# Patient Record
Sex: Male | Born: 1970 | Race: White | Hispanic: No | Marital: Married | Smoking: Current every day smoker
Health system: Southern US, Community
[De-identification: ages and names within clinical notes are randomized; demographics above are authoritative.]

## PROBLEM LIST (undated history)

## (undated) DIAGNOSIS — E119 Type 2 diabetes mellitus without complications: Secondary | ICD-10-CM

## (undated) DIAGNOSIS — Z9109 Other allergy status, other than to drugs and biological substances: Secondary | ICD-10-CM

## (undated) DIAGNOSIS — E669 Obesity, unspecified: Secondary | ICD-10-CM

## (undated) DIAGNOSIS — M5020 Other cervical disc displacement, unspecified cervical region: Secondary | ICD-10-CM

## (undated) DIAGNOSIS — F319 Bipolar disorder, unspecified: Secondary | ICD-10-CM

## (undated) DIAGNOSIS — M542 Cervicalgia: Secondary | ICD-10-CM

## (undated) HISTORY — DX: Other allergy status, other than to drugs and biological substances: Z91.09

## (undated) HISTORY — DX: Cervicalgia: M54.2

## (undated) HISTORY — DX: Type 2 diabetes mellitus without complications: E11.9

## (undated) HISTORY — PX: VASECTOMY: SHX75

## (undated) HISTORY — DX: Bipolar disorder, unspecified: F31.9

## (undated) HISTORY — DX: Other cervical disc displacement, unspecified cervical region: M50.20

## (undated) HISTORY — DX: Obesity, unspecified: E66.9

---

## 2005-07-10 ENCOUNTER — Encounter: Payer: Self-pay | Admitting: Family Medicine

## 2005-07-10 ENCOUNTER — Ambulatory Visit: Payer: Self-pay | Admitting: Chiropractic Medicine

## 2005-07-11 ENCOUNTER — Ambulatory Visit: Payer: Self-pay | Admitting: Chiropractic Medicine

## 2008-11-08 ENCOUNTER — Ambulatory Visit: Payer: Self-pay | Admitting: Family Medicine

## 2008-11-08 DIAGNOSIS — J309 Allergic rhinitis, unspecified: Secondary | ICD-10-CM | POA: Insufficient documentation

## 2008-11-08 DIAGNOSIS — E669 Obesity, unspecified: Secondary | ICD-10-CM

## 2008-11-08 DIAGNOSIS — M542 Cervicalgia: Secondary | ICD-10-CM

## 2008-11-08 DIAGNOSIS — R5383 Other fatigue: Secondary | ICD-10-CM

## 2008-11-08 DIAGNOSIS — R5381 Other malaise: Secondary | ICD-10-CM | POA: Insufficient documentation

## 2008-11-10 ENCOUNTER — Ambulatory Visit: Payer: Self-pay | Admitting: Family Medicine

## 2008-11-14 LAB — CONVERTED CEMR LAB
ALT: 26 units/L (ref 0–53)
AST: 22 units/L (ref 0–37)
Albumin: 4.1 g/dL (ref 3.5–5.2)
BUN: 18 mg/dL (ref 6–23)
Basophils Relative: 1 % (ref 0.0–3.0)
Chloride: 105 meq/L (ref 96–112)
Creatinine, Ser: 1 mg/dL (ref 0.4–1.5)
Eosinophils Absolute: 0.3 10*3/uL (ref 0.0–0.7)
Eosinophils Relative: 4.3 % (ref 0.0–5.0)
GFR calc non Af Amer: 90 mL/min
Glucose, Bld: 90 mg/dL (ref 70–99)
HCT: 44.5 % (ref 39.0–52.0)
MCV: 90.1 fL (ref 78.0–100.0)
Neutrophils Relative %: 49.4 % (ref 43.0–77.0)
RBC: 4.94 M/uL (ref 4.22–5.81)
TSH: 1.97 microintl units/mL (ref 0.35–5.50)
Total Protein: 6.6 g/dL (ref 6.0–8.3)
WBC: 6.3 10*3/uL (ref 4.5–10.5)

## 2009-04-26 ENCOUNTER — Telehealth: Payer: Self-pay | Admitting: Family Medicine

## 2010-03-29 ENCOUNTER — Ambulatory Visit: Payer: Self-pay | Admitting: Family Medicine

## 2010-03-29 DIAGNOSIS — F319 Bipolar disorder, unspecified: Secondary | ICD-10-CM | POA: Insufficient documentation

## 2010-04-26 ENCOUNTER — Ambulatory Visit: Payer: Self-pay | Admitting: Family Medicine

## 2010-05-16 ENCOUNTER — Telehealth: Payer: Self-pay | Admitting: Family Medicine

## 2010-05-23 ENCOUNTER — Ambulatory Visit: Payer: Self-pay | Admitting: Family Medicine

## 2010-10-02 NOTE — Assessment & Plan Note (Signed)
Summary: 6 wk f/u dlo   Vital Signs:  Patient profile:   40 year old male Height:      72 inches Weight:      214.0 pounds BMI:     29.13 Temp:     99.0 degrees F oral Pulse rate:   72 / minute Pulse rhythm:   regular BP sitting:   110 / 70  (left arm) Cuff size:   regular  Vitals Entered By: Benny Lennert CMA Duncan Dull) (May 23, 2010 9:33 AM)  History of Present Illness: Chief complaint 6 week follow up   40 year old male:      Allergies (verified): No Known Drug Allergies   Impression & Recommendations:  Problem # 1:  DEPRESSION (ICD-311) >15 minutes spent in face to face time with patient, >50% spent in counselling or coordination of care: doing somewhat better, but having several episodes of anorgasmia. Discussed +/- and will titrate of paxil and titrate on celexa. C/w therapist once a week. sleep schedule still eratic with band.   The following medications were removed from the medication list:    Paroxetine Hcl 25 Mg Xr24h-tab (Paroxetine hcl) .Marland Kitchen... 1 by mouth daily    Paroxetine Hcl 12.5 Mg Xr24h-tab (Paroxetine hcl) .Marland Kitchen... 1 by mouth daily His updated medication list for this problem includes:    Trazodone Hcl 100 Mg Tabs (Trazodone hcl) .Marland Kitchen... 1 by mouth at bedtime as needed insomnia    Citalopram Hydrobromide 20 Mg Tabs (Citalopram hydrobromide) .Marland Kitchen... 1 by mouth daily  Complete Medication List: 1)  Trazodone Hcl 100 Mg Tabs (Trazodone hcl) .Marland Kitchen.. 1 by mouth at bedtime as needed insomnia 2)  Citalopram Hydrobromide 20 Mg Tabs (Citalopram hydrobromide) .Marland Kitchen.. 1 by mouth daily  Patient Instructions: 1)  Paxil dosing: go down to 25 mg a day for 1 week 2)  Then go down to 12.5 mg a day for 1 week 3)  At the same time that you start this at 12.5 mg, cut the celexa 20 mg tablets in half (10 mg), and do this for 1 week 4)  When off paxil, take Citalopram 20 mg a day 5)  f/u 6 weeks Prescriptions: CITALOPRAM HYDROBROMIDE 20 MG TABS (CITALOPRAM HYDROBROMIDE) 1 by  mouth daily  #30 x 5   Entered and Authorized by:   Hannah Beat MD   Signed by:   Hannah Beat MD on 05/23/2010   Method used:   Electronically to        [facility]. [phone]* (retail)       [address]       [place], [place]  [address]       Ph: [phone]       Fax: [phone]   RxID:   [id number]   Current Allergies (reviewed today): No known allergies

## 2010-10-02 NOTE — Assessment & Plan Note (Signed)
Summary: ROA 1 MTHS CYD   Vital Signs:  Patient profile:   40 year old male Height:      72 inches Weight:      211.8 pounds BMI:     28.83 Temp:     98.7 degrees F oral Pulse rate:   64 / minute Pulse rhythm:   regular BP sitting:   120 / 70  (left arm) Cuff size:   regular  Vitals Entered By: Benny Lennert CMA Duncan Dull) (April 26, 2010 9:36 AM) CC: 1 month follow up   Allergies (verified): No Known Drug Allergies   Impression & Recommendations:  Problem # 1:  DEPRESSION (ICD-311) >15 minutes spent in face to face time with patient, >50% spent in counselling or coordination of care: the patient is about the same. Not worse. Has gone to one counseling session. He is tolerating his Paxil without side effects. No physical activity. Since the last office visit, he has not smoked any. He had previously been smoking every day for about 10 years. And then increase his Paxil dosing to 37.5 mg, add some trazodone at night for sleep. He is sleeping about 4-5 hours a night. Followup 4-5 weeks  The following medications were removed from the medication list:    Paroxetine Hcl 12.5 Mg Xr24h-tab (Paroxetine hcl) .Marland Kitchen... 1 by mouth daily for 1 week, then increase to 2 by mouth daily    Paroxetine Hcl 25 Mg Xr24h-tab (Paroxetine hcl) .Marland Kitchen... 1 by mouth daily His updated medication list for this problem includes:    Paroxetine Hcl 25 Mg Xr24h-tab (Paroxetine hcl) .Marland Kitchen... 1 by mouth daily    Trazodone Hcl 100 Mg Tabs (Trazodone hcl) .Marland Kitchen... 1 by mouth at bedtime as needed insomnia    Paroxetine Hcl 12.5 Mg Xr24h-tab (Paroxetine hcl) .Marland Kitchen... 1 by mouth daily  Complete Medication List: 1)  Paroxetine Hcl 25 Mg Xr24h-tab (Paroxetine hcl) .Marland Kitchen.. 1 by mouth daily 2)  Trazodone Hcl 100 Mg Tabs (Trazodone hcl) .Marland Kitchen.. 1 by mouth at bedtime as needed insomnia 3)  Paroxetine Hcl 12.5 Mg Xr24h-tab (Paroxetine hcl) .Marland Kitchen.. 1 by mouth daily  Patient Instructions: 1)  f/u 4-5 weeks Prescriptions: PAROXETINE HCL 12.5 MG  XR24H-TAB (PAROXETINE HCL) 1 by mouth daily  #30 x 11   Entered and Authorized by:   Hannah Beat MD   Signed by:   Hannah Beat MD on 04/26/2010   Method used:   Print then Give to Patient   RxID:   [id number] TRAZODONE HCL 100 MG TABS (TRAZODONE HCL) 1 by mouth at bedtime as needed insomnia  #30 x 5   Entered and Authorized by:   Hannah Beat MD   Signed by:   Hannah Beat MD on 04/26/2010   Method used:   Print then Give to Patient   RxID:   [id number] PAROXETINE HCL 25 MG XR24H-TAB (PAROXETINE HCL) 1 by mouth daily  #30 x 11   Entered and Authorized by:   Hannah Beat MD   Signed by:   Hannah Beat MD on 04/26/2010   Method used:   Print then Give to Patient   RxID:   [id number] PAROXETINE HCL 25 MG XR24H-TAB (PAROXETINE HCL) 1 by mouth daily  #30 x 11   Entered and Authorized by:   Hannah Beat MD   Signed by:   Hannah Beat MD on 04/26/2010   Method used:   Print then Give to Patient   RxID:   [id number]   Current Allergies (reviewed today): No known  allergies

## 2010-10-02 NOTE — Assessment & Plan Note (Signed)
Summary: NOT FEELING WELL/CLE   Vital Signs:  Patient profile:   40 year old male Height:      72 inches Weight:      217.6 pounds BMI:     29.62 Temp:     98.7 degrees F oral Pulse rate:   64 / minute Pulse rhythm:   regular BP sitting:   110 / 74  (left arm) Cuff size:   regular  Vitals Entered By: Benny Lennert CMA Duncan Dull) (March 29, 2010 4:03 PM)  History of Present Illness: Chief complaint Not feeling well  Allergies (verified): No Known Drug Allergies   Impression & Recommendations:  Problem # 1:  DEPRESSION (ICD-311) >25 minutes spent in face to face time with patient, >50% spent in counselling or coordination of care: the patient is significantly depressed. He is only sleeping about 6 hours a night, and he wants to stay in bed longer than normal. Is not having fun his normal activities. He did not want play music. He is not homicidal or suicidal. He is not interacting with his family or friends as he normally does. He did get paranoid some and smoking some MJ, but he has not done this in about a month. Does have a strong family history of anxiety and depression. Titrate up Paxil. The patient is able to get counseling through his wife's work, and we recommended this very much. I'll also check a thyroid.  His updated medication list for this problem includes:    Paroxetine Hcl 12.5 Mg Xr24h-tab (Paroxetine hcl) .Marland Kitchen... 1 by mouth daily for 1 week, then increase to 2 by mouth daily    Paroxetine Hcl 25 Mg Xr24h-tab (Paroxetine hcl) .Marland Kitchen... 1 by mouth daily  Orders: Venipuncture ([address]) TLB-TSH (Thyroid Stimulating Hormone) [phone]) Psychology Referral (Psychology)  Complete Medication List: 1)  Paroxetine Hcl 12.5 Mg Xr24h-tab (Paroxetine hcl) .Marland Kitchen.. 1 by mouth daily for 1 week, then increase to 2 by mouth daily 2)  Paroxetine Hcl 25 Mg Xr24h-tab (Paroxetine hcl) .Marland Kitchen.. 1 by mouth daily  Patient Instructions: 1)  Referral Appointment Information 2)  Day/Date: 3)   Time: 4)  Place/MD: 5)  Address: 6)  Phone/Fax: 7)  Patient given appointment information. Information/Orders faxed/mailed.  8)  FOLLOW-UP WITH ME IN 1 MONTH Prescriptions: PAROXETINE HCL 25 MG XR24H-TAB (PAROXETINE HCL) 1 by mouth daily  #30 x 0   Entered and Authorized by:   Hannah Beat MD   Signed by:   Hannah Beat MD on 03/29/2010   Method used:   Print then Give to Patient   RxID:   [id number] PAROXETINE HCL 12.5 MG XR24H-TAB (PAROXETINE HCL) 1 by mouth daily for 1 week, then increase to 2 by mouth daily  #60 x 0   Entered and Authorized by:   Hannah Beat MD   Signed by:   Hannah Beat MD on 03/29/2010   Method used:   Print then Give to Patient   RxID:   [id number] PAROXETINE HCL 25 MG XR24H-TAB (PAROXETINE HCL) 1 by mouth daily  #30 x 0   Entered and Authorized by:   Hannah Beat MD   Signed by:   Hannah Beat MD on 03/29/2010   Method used:   Print then Give to Patient   RxID:   [id number] PAROXETINE HCL 12.5 MG XR24H-TAB (PAROXETINE HCL) 1 by mouth daily  #60 x 1   Entered and Authorized by:   Hannah Beat MD   Signed by:   Hannah Beat MD on 03/29/2010  Method used:   Print then Give to Patient   RxID:   [id number]   Current Allergies (reviewed today): No known allergies

## 2010-10-02 NOTE — Progress Notes (Signed)
Summary: Rx Paroxetine  Phone Note Refill Request Call back at [phone] Message from:  [facility] on May 16, 2010 1:09 PM  Refills Requested: Medication #1:  PAROXETINE HCL 25 MG XR24H-TAB 1 by mouth daily   Last Refilled: 04/26/2010 Received E-script request please advise.  This is a Dr. Patsy Lager patient and he will be out of the office until next week.   Method Requested: Telephone to Pharmacy Initial call taken by: Linde Gillis CMA Duncan Dull),  May 16, 2010 1:09 PM    Prescriptions: PAROXETINE HCL 25 MG XR24H-TAB (PAROXETINE HCL) 1 by mouth daily  #30 x 5   Entered and Authorized by:   Kerby Nora MD   Signed by:   Kerby Nora MD on 05/16/2010   Method used:   Electronically to        [facility]. [phone]* (retail)       [address]       [place], [place]  [address]       Ph: [phone]       Fax: [phone]   RxID:   [id number]

## 2011-11-11 ENCOUNTER — Other Ambulatory Visit (INDEPENDENT_AMBULATORY_CARE_PROVIDER_SITE_OTHER): Payer: BC Managed Care – PPO

## 2011-11-11 DIAGNOSIS — R5381 Other malaise: Secondary | ICD-10-CM

## 2011-11-11 DIAGNOSIS — R5383 Other fatigue: Secondary | ICD-10-CM

## 2011-11-11 DIAGNOSIS — Z13220 Encounter for screening for lipoid disorders: Secondary | ICD-10-CM

## 2011-11-11 LAB — COMPREHENSIVE METABOLIC PANEL
ALT: 21 U/L (ref 0–53)
Albumin: 4.3 g/dL (ref 3.5–5.2)
CO2: 28 mEq/L (ref 19–32)
Calcium: 9.3 mg/dL (ref 8.4–10.5)
Chloride: 103 mEq/L (ref 96–112)
GFR: 85.76 mL/min (ref >60.00)
Potassium: 4.8 mEq/L (ref 3.5–5.1)
Sodium: 138 mEq/L (ref 135–145)
Total Protein: 6.8 g/dL (ref 6.0–8.3)

## 2011-11-11 LAB — LIPID PANEL
Total CHOL/HDL Ratio: 5
Triglycerides: 170 mg/dL — ABNORMAL HIGH (ref 0.0–149.0)

## 2011-11-15 ENCOUNTER — Encounter: Payer: Self-pay | Admitting: Family Medicine

## 2011-11-18 ENCOUNTER — Encounter: Payer: Self-pay | Admitting: Family Medicine

## 2011-11-18 ENCOUNTER — Ambulatory Visit (INDEPENDENT_AMBULATORY_CARE_PROVIDER_SITE_OTHER): Payer: BC Managed Care – PPO | Admitting: Family Medicine

## 2011-11-18 VITALS — BP 116/78 | HR 82 | Temp 97.9°F | Ht 72.0 in | Wt 216.0 lb

## 2011-11-18 DIAGNOSIS — Z800 Family history of malignant neoplasm of digestive organs: Secondary | ICD-10-CM

## 2011-11-18 DIAGNOSIS — Z1211 Encounter for screening for malignant neoplasm of colon: Secondary | ICD-10-CM

## 2011-11-18 DIAGNOSIS — G4730 Sleep apnea, unspecified: Secondary | ICD-10-CM

## 2011-11-18 DIAGNOSIS — Z0000 Encounter for general adult medical examination without abnormal findings: Secondary | ICD-10-CM

## 2011-11-18 NOTE — Progress Notes (Signed)
Patient Name: Alexander [name] Date of Birth: 11/20/1970 Age: 41 y.o. Medical Record Number: [id number] Gender: male Date of Encounter: 11/18/2011  History of Present Illness:  Alexander [name] is a 41 y.o. very pleasant male patient who presents with the following:  Not sleeping well - still feeling like his mind is racing Lost the desire and to practice and to have as much fun  Dr. Zorita Pang [phone]  Had been on trazadone. Maybe something else? For sleep For a short time on Geodon, then Zyprexa  The patient was diagnosed with bipolar disorder last year, and he has been significantly depressed off and on for the last 3 years. He did have a episode of hypomania, that ultimately he saw psychiatry that led to his current diagnosis. 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  Preventative Health Maintenance Visit:  Health Maintenance Summary Reviewed and updated, unless pt declines services.  Tobacco History Reviewed. Alcohol: no etoh or mj now since dx Exercise Habits: rare STD concerns: no risk or activity to increase risk Drug Use: None Encouraged self-testicular check  Labs reviewed with the patient.   Lipids:    Component Value Date/Time   CHOL 172 11/11/2011 1055   TRIG 170.0* 11/11/2011 1055   HDL 36.10* 11/11/2011 1055   VLDL 34.0 11/11/2011 1055   CHOLHDL 5 11/11/2011 1055    CBC:    Component Value Date/Time   WBC 6.3 11/10/2008 0901   HGB 15.4 11/10/2008 0901   HCT 44.5 11/10/2008 0901   PLT 237 11/10/2008 0901   MCV 90.1 11/10/2008 0901   NEUTROABS 3.0 11/10/2008 0901   MONOABS 0.5 11/10/2008 0901   EOSABS 0.3 11/10/2008 0901   BASOSABS 0.1 11/10/2008 0901    Basic Metabolic Panel:    Component Value Date/Time   NA 138 11/11/2011 1055   K 4.8 11/11/2011 1055   CL 103 11/11/2011 1055   CO2 28 11/11/2011 1055   BUN 17 11/11/2011 1055   CREATININE 1.0 11/11/2011 1055   GLUCOSE 92 11/11/2011 1055   CALCIUM 9.3  11/11/2011 1055    Lab Results  Component Value Date   ALT 21 11/11/2011   AST 12 11/11/2011   ALKPHOS 62 11/11/2011   BILITOT 0.4 11/11/2011   Patient Active Problem List  Diagnoses  . OBESITY  . Bipolar 1 disorder  . ALLERGIC RHINITIS  . NECK PAIN, CHRONIC  . FATIGUE   Past Medical History  Diagnosis Date  . Pollen allergies   . Neck pain   . Herniated cervical disc     C6  . Obesity    No past surgical history on file. History  Substance Use Topics  . Smoking status: Never Smoker   . Smokeless tobacco: Not on file  . Alcohol Use: Yes   No family history on file. No Known Allergies No current outpatient prescriptions on file prior to visit.      Past Medical History, Surgical History, Social History, Family History, Problem List, Medications, and Allergies have been reviewed and updated if relevant.  Review of Systems:  General: Denies fever, chills, sweats. No significant weight loss. GENERAL FATIGUE, LACK OF SLEEP Eyes: Denies blurring,significant itching ENT: Denies earache, sore throat, and hoarseness. Cardiovascular: Denies chest pains, palpitations, dyspnea on exertion Respiratory: Denies cough, dyspnea at rest,wheeezing Breast: no concerns about lumps GI: Denies nausea, vomiting, diarrhea, constipation, change in bowel habits, abdominal pain, melena, hematochezia GU: Denies penile discharge, ED, urinary flow / outflow problems. No  STD concerns. Musculoskeletal: Denies back pain, joint pain Derm: Denies rash, itching Neuro: Denies  paresthesias, frequent falls, frequent headaches Psych: CONTINUED DEPRESSION. NO MANIA. POOR SLEEP ONGOING FOR YEARS. FATIGUED MUCH OF THE DAY Endocrine: Denies cold intolerance, heat intolerance, polydipsia Heme: Denies enlarged lymph nodes Allergy: No hayfever   Physical Examination: Filed Vitals:   11/18/11 1442  BP: 116/78  Pulse: 82  Temp: 97.9 F (36.6 C)  TempSrc: Oral  Height: 6' (1.829 m)  Weight: 216 lb  (97.977 kg)  SpO2: 99%    Body mass index is 29.29 kg/(m^2).   Wt Readings from Last 3 Encounters:  11/18/11 216 lb (97.977 kg)  05/23/10 214 lb (97.07 kg)  04/26/10 211 lb 12.8 oz (96.072 kg)    GEN: well developed, well nourished, no acute distress Eyes: conjunctiva and lids normal, PERRLA, EOMI ENT: TM clear, nares clear, oral exam WNL Neck: supple, no lymphadenopathy, no thyromegaly, no JVD Pulm: clear to auscultation and percussion, respiratory effort normal CV: regular rate and rhythm, S1-S2, no murmur, rub or gallop, no bruits, peripheral pulses normal and symmetric, no cyanosis, clubbing, edema or varicosities Chest: no scars, masses GI: soft, non-tender; no hepatosplenomegaly, masses; active bowel sounds all quadrants Lymph: no cervical, axillary or inguinal adenopathy MSK: gait normal, muscle tone and strength WNL, no joint swelling, effusions, discoloration, crepitus  SKIN: clear, good turgor, color WNL, no rashes, lesions, or ulcerations Neuro: normal mental status, normal strength, sensation, and motion Psych: alert; oriented to person, place and time. Flat affect   Assessment and Plan:  1. Routine general medical examination at a health care facility    2. Sleep apnea  Ambulatory referral to Pulmonology  3. Screening for colon cancer  Ambulatory referral to Gastroenterology  4. Family history of colon cancer  Ambulatory referral to Gastroenterology    Orders Today: Orders Placed This Encounter  Procedures  . Ambulatory referral to Pulmonology    Referral Priority:  Routine    Referral Type:  Consultation    Referral Reason:  Specialty Services Required    Requested Specialty:  Pulmonary Disease    Number of Visits Requested:  1  . Ambulatory referral to Gastroenterology    Referral Priority:  Routine    Referral Type:  Consultation    Referral Reason:  Specialty Services Required    Requested Specialty:  Gastroenterology    Number of Visits Requested:   1    Medications Today: Meds ordered this encounter  Medications  . ABILIFY 20 MG tablet    Sig: Take 1 tablet by mouth daily.  Marland Kitchen buPROPion (WELLBUTRIN SR) 150 MG 12 hr tablet    Sig: Take 2 tablets by mouth daily.  Marland Kitchen zolpidem (AMBIEN) 10 MG tablet    Sig: Take 0.5 tablets by mouth daily.    The patient's preventative maintenance and recommended screening tests for an annual wellness exam were reviewed in full today. Brought up to date unless services declined.  Counselled on the importance of diet, exercise, and its role in overall health and mortality. The patient's FH and SH was reviewed, including their home life, tobacco status, and drug and alcohol status.   Given mothers early age of diagnosis at colon cancer diagnosis, at age 38, her operating surgeon and cancer team recommended that the patient and his siblings have early colonoscopy for screening of colon cancer given their increased risk.  The patient is fatigued and feels tired much of the day. He never sleeps well. He will sleep for  several hours, then wake up, and will sleep off and on for the rest of the night. He is 216 pounds, and his psychiatrist raises the question of sleep apnea, which is a very viable question. Consult sleep medicine for their opinion and consideration and testing for potential sleep apnea or other suggestions.

## 2011-11-18 NOTE — Patient Instructions (Signed)
REFERRAL: GO THE THE FRONT ROOM AT THE ENTRANCE OF OUR CLINIC, NEAR CHECK IN. ASK FOR MARION. SHE WILL HELP YOU SET UP YOUR REFERRAL. DATE: TIME:  

## 2011-11-19 ENCOUNTER — Encounter: Payer: Self-pay | Admitting: Family Medicine

## 2011-11-20 ENCOUNTER — Encounter: Payer: Self-pay | Admitting: Internal Medicine

## 2011-12-05 ENCOUNTER — Encounter: Payer: Self-pay | Admitting: Internal Medicine

## 2011-12-09 ENCOUNTER — Encounter: Payer: Self-pay | Admitting: Pulmonary Disease

## 2011-12-09 ENCOUNTER — Ambulatory Visit (INDEPENDENT_AMBULATORY_CARE_PROVIDER_SITE_OTHER): Payer: BC Managed Care – PPO | Admitting: Pulmonary Disease

## 2011-12-09 VITALS — BP 132/78 | HR 90 | Temp 98.8°F | Ht 72.0 in | Wt 218.8 lb

## 2011-12-09 DIAGNOSIS — Z72821 Inadequate sleep hygiene: Secondary | ICD-10-CM

## 2011-12-09 DIAGNOSIS — F518 Other sleep disorders not due to a substance or known physiological condition: Secondary | ICD-10-CM

## 2011-12-09 NOTE — Progress Notes (Signed)
  Subjective:    Patient ID: Alexander [name], male    DOB: 1970-12-04, 41 y.o.   MRN: [id number]  HPI The patient is a 41 year old male who I've been asked to see for difficulty maintaining sleep.  This is been going on for at least one year, and is complicated by her recent diagnosis of bipolar disorder.  The patient states that he starts getting sleepy around 10 PM while in his lounge chair watching TV, and then will doze off and on through the night in total approximately 3 to 4 AM.  He thinks he will sleep at least 2-3 hours during this time.  He will then get up and go to bed, but awakens multiple times with difficulty getting back to sleep intermittently.  He will typically get up between 12 noon and 2 PM to start his day.  He has been noted to have snoring, and admits to occasional snoring arousals.  His spouse does not sleep in the same bed, but no one has mentioned an abnormal breathing pattern during sleep.  His father has been diagnosed with sleep apnea.  The patient denies any chronic pain issues, and feels that his sleeping environment is adequate.  He has a pet that sleeps in the same room with him, but not in the same bed.  He denies kicking during sleep, and also has no symptoms consistent with RLS.  He has some sleep pressure during the day, but it is not overwhelming.  His Epworth score today is only 7.  The patient also states that he has lost 20 pounds over the last 2 years.   Review of Systems  Constitutional: Negative for fever and unexpected weight change.  HENT: Negative for ear pain, nosebleeds, congestion, sore throat, rhinorrhea, sneezing, trouble swallowing, dental problem, postnasal drip and sinus pressure.   Eyes: Negative for redness and itching.  Respiratory: Negative for cough, chest tightness, shortness of breath and wheezing.   Cardiovascular: Negative for palpitations and leg swelling.  Gastrointestinal: Negative for nausea and vomiting.  Genitourinary: Negative for  dysuria.  Musculoskeletal: Negative for joint swelling.  Skin: Negative for rash.  Neurological: Negative for headaches.  Hematological: Does not bruise/bleed easily.  Psychiatric/Behavioral: Positive for dysphoric mood. The patient is not nervous/anxious.        Objective:   Physical Exam Constitutional:  Well developed, no acute distress  HENT:  Nares patent without discharge, mild mucosal edema  Oropharynx without exudate, palate and uvula are moderately thickened and elongated.   Eyes:  Perrla, eomi, no scleral icterus  Neck:  No JVD, no TMG  Cardiovascular:  Normal rate, regular rhythm, no rubs or gallops.  No murmurs        Intact distal pulses  Pulmonary :  Normal breath sounds, no stridor or respiratory distress   No rales, rhonchi, or wheezing  Abdominal:  Soft, nondistended, bowel sounds present.  No tenderness noted.   Musculoskeletal:  No lower extremity edema noted.  Lymph Nodes:  No cervical lymphadenopathy noted  Skin:  No cyanosis noted  Neurologic:  Awake, but appears sleepy, appropriate, moves all 4 extremities without obvious deficit.         Assessment & Plan:

## 2011-12-09 NOTE — Assessment & Plan Note (Signed)
The patient clearly has very poor sleep hygiene currently.  A lot of his sleep fragmentation is due to his own behavior, and has become more of a habit than anything else.  Of course, a lot of this is complicated by his bipolar disorder.  It is really unclear if he has a superimposed sleep disorder such as sleep apnea or a movement disorder of sleep, but my suspicion is not overly high.  I have discussed with him proceeding with behavioral therapy alone, and then doing a sleep study if he continues to have sleep issues.  The other option is to proceed with a sleep study now, and if unremarkable would continue to work on behavioral therapies.  I've asked the patient to think about both of these approaches.  In the meantime, I have gone over what constitutes good sleep hygiene, as well as stimulus control therapy.

## 2011-12-09 NOTE — Patient Instructions (Signed)
Try taking melatonin 3mg  about 3-4 hrs BEFORE bedtime. Establish bedtime between 12-1am, and DO NOT SLEEP at all prior to that time.   Do not read or watch tv in bed ever, and stay out of bedroom during day If you cannot fall asleep within 30-45min, leave bedroom and go watch tv or read until you get sleepy again.  Then go back to bedroom.  Do not doze in family room.  If you cannot fall asleep, start all over again.  This also applies to if you awaken and cannot get back to sleep. No computer, eating, drinking, puzzles, or active behavior during the night while trying to get back to sleep.  (really no computer after 10pm). Get up each am between 6-8am, and no further sleeping until the following night. Think about whether you wish to pursue sleep study now vs working on behavioral therapy first to see if things will improve.  Let me know followup with me in 4 weeks to check on your progress.,

## 2011-12-11 ENCOUNTER — Encounter: Payer: Self-pay | Admitting: Internal Medicine

## 2011-12-11 ENCOUNTER — Ambulatory Visit (INDEPENDENT_AMBULATORY_CARE_PROVIDER_SITE_OTHER): Payer: BC Managed Care – PPO | Admitting: Internal Medicine

## 2011-12-11 VITALS — BP 138/76 | HR 88 | Ht 72.0 in | Wt 215.0 lb

## 2011-12-11 DIAGNOSIS — Z800 Family history of malignant neoplasm of digestive organs: Secondary | ICD-10-CM | POA: Insufficient documentation

## 2011-12-11 NOTE — Patient Instructions (Signed)
Follow up for colon cancer screening at age 41

## 2011-12-11 NOTE — Progress Notes (Signed)
Subjective:    Patient ID: Alexander [name], male    DOB: 02/13/1971, 41 y.o.   MRN: [id number]  HPI Alexander [name] is a 41 year old male with a past medical history of bipolar 1 disorder who seen in consultation at the request of Alexander [name] for evaluation given his family history of colorectal cancer. The patient is without symptoms today. He denies a change in his bowel habits, blood in his stools, melena. No abdominal pain. No nausea, vomiting. No significant heartburn. No trouble swallowing or painful swallowing. No unintentional weight loss, and his appetite is good.  His mother recently required a partial colectomy for colorectal cancer. This occurred at age 73. To his knowledge there is no other family history of colorectal cancer nor colon polyps.  Review of Systems  as per history of present illness, otherwise negative  Patient Active Problem List  Diagnoses  . OBESITY  . Bipolar 1 disorder  . ALLERGIC RHINITIS  . NECK PAIN, CHRONIC  . FATIGUE  . Inadequate sleep hygiene   Current Outpatient Prescriptions  Medication Sig Dispense Refill  . ABILIFY 20 MG tablet Take 1 tablet by mouth daily.      Marland Kitchen buPROPion (WELLBUTRIN SR) 150 MG 12 hr tablet Take 2 tablets by mouth daily.      Marland Kitchen zolpidem (AMBIEN) 10 MG tablet Take 0.5 tablets by mouth daily.       No Known Allergies  Family History  Problem Relation Age of Onset  . Colon cancer Mother 21  . Breast cancer Mother 41   Social History  . Marital Status: Married   Occupational History  . MUSICIAN    Social History Main Topics  . Smoking status: Former Smoker -- 0.1 packs/day for 3 years    Types: Cigarettes    Quit date: 09/02/1996  . Smokeless tobacco: Never Used   Comment: only smoked socially through college.  . Alcohol Use: Yes     2 beers a week  . Drug Use: No     OCCASIONAL MJ      Objective:   Physical Exam BP 138/76  Pulse 88  Ht 6' (1.829 m)  Wt 215 lb (97.523 kg)  BMI 29.16 kg/m2 Constitutional:  Well-developed and well-nourished. No distress. HEENT: Normocephalic and atraumatic. Oropharynx is clear and moist. No oropharyngeal exudate. Conjunctivae are normal. Pupils are equal round and reactive to light. No scleral icterus. Neck: Neck supple. Trachea midline. Cardiovascular: Normal rate, regular rhythm and intact distal pulses. No M/R/G Pulmonary/chest: Effort normal and breath sounds normal. No wheezing, rales or rhonchi. Abdominal: Soft, nontender, nondistended. Bowel sounds active throughout. There are no masses palpable. No hepatosplenomegaly. Extremities: no clubbing, cyanosis, or edema Lymphadenopathy: No cervical adenopathy noted. Neurological: Alert and oriented to person place and time. Skin: Skin is warm and dry. No rashes noted. Multiple tattoos. Psychiatric: Normal mood and affect. Behavior is normal.    Assessment & Plan:  41 year old male with a past medical history of bipolar 1 disorder who seen in consultation at the request of Alexander [name] for evaluation given his family history of colorectal cancer.  1. Family history of colorectal cancer -- based on current GI and NCCN guidelines, he is elevated risk given his mother's history of colorectal cancer, and guidelines support beginning screening colonoscopy at age 52. This is based on the history of one first-degree relative with colorectal cancer diagnosed at age 82. Guidelines support beginning screening at age 70 or 10 years before earliest diagnosis of CRC. We discussed how  this recommendation could change, should another first-degree relative be diagnosed with colorectal cancer or early colorectal adenomatous polyps. He voiced understanding.  Return PRN.

## 2012-02-12 ENCOUNTER — Ambulatory Visit (INDEPENDENT_AMBULATORY_CARE_PROVIDER_SITE_OTHER): Payer: BC Managed Care – PPO | Admitting: Psychology

## 2012-02-12 DIAGNOSIS — F331 Major depressive disorder, recurrent, moderate: Secondary | ICD-10-CM

## 2012-03-03 ENCOUNTER — Ambulatory Visit (INDEPENDENT_AMBULATORY_CARE_PROVIDER_SITE_OTHER): Payer: BC Managed Care – PPO | Admitting: Psychology

## 2012-03-03 DIAGNOSIS — F331 Major depressive disorder, recurrent, moderate: Secondary | ICD-10-CM

## 2012-03-11 ENCOUNTER — Ambulatory Visit (INDEPENDENT_AMBULATORY_CARE_PROVIDER_SITE_OTHER): Payer: BC Managed Care – PPO | Admitting: Psychology

## 2012-03-11 DIAGNOSIS — F331 Major depressive disorder, recurrent, moderate: Secondary | ICD-10-CM

## 2012-03-31 ENCOUNTER — Ambulatory Visit (INDEPENDENT_AMBULATORY_CARE_PROVIDER_SITE_OTHER): Payer: BC Managed Care – PPO | Admitting: Psychology

## 2012-03-31 DIAGNOSIS — F331 Major depressive disorder, recurrent, moderate: Secondary | ICD-10-CM

## 2012-04-14 ENCOUNTER — Ambulatory Visit (INDEPENDENT_AMBULATORY_CARE_PROVIDER_SITE_OTHER): Payer: BC Managed Care – PPO | Admitting: Psychology

## 2012-04-14 DIAGNOSIS — F331 Major depressive disorder, recurrent, moderate: Secondary | ICD-10-CM

## 2012-04-29 ENCOUNTER — Ambulatory Visit (INDEPENDENT_AMBULATORY_CARE_PROVIDER_SITE_OTHER): Payer: BC Managed Care – PPO | Admitting: Psychology

## 2012-04-29 DIAGNOSIS — F331 Major depressive disorder, recurrent, moderate: Secondary | ICD-10-CM

## 2012-05-13 ENCOUNTER — Ambulatory Visit (INDEPENDENT_AMBULATORY_CARE_PROVIDER_SITE_OTHER): Payer: BC Managed Care – PPO | Admitting: Psychology

## 2012-05-13 DIAGNOSIS — F331 Major depressive disorder, recurrent, moderate: Secondary | ICD-10-CM

## 2012-06-03 ENCOUNTER — Ambulatory Visit (INDEPENDENT_AMBULATORY_CARE_PROVIDER_SITE_OTHER): Payer: BC Managed Care – PPO | Admitting: Psychology

## 2012-06-03 DIAGNOSIS — F331 Major depressive disorder, recurrent, moderate: Secondary | ICD-10-CM

## 2012-07-08 ENCOUNTER — Ambulatory Visit (INDEPENDENT_AMBULATORY_CARE_PROVIDER_SITE_OTHER): Payer: BC Managed Care – PPO | Admitting: Psychology

## 2012-07-08 DIAGNOSIS — F331 Major depressive disorder, recurrent, moderate: Secondary | ICD-10-CM

## 2012-07-29 ENCOUNTER — Ambulatory Visit (INDEPENDENT_AMBULATORY_CARE_PROVIDER_SITE_OTHER): Payer: BC Managed Care – PPO | Admitting: Psychology

## 2012-07-29 DIAGNOSIS — F331 Major depressive disorder, recurrent, moderate: Secondary | ICD-10-CM

## 2012-09-08 ENCOUNTER — Ambulatory Visit (INDEPENDENT_AMBULATORY_CARE_PROVIDER_SITE_OTHER): Payer: BC Managed Care – PPO | Admitting: Psychology

## 2012-09-08 DIAGNOSIS — F331 Major depressive disorder, recurrent, moderate: Secondary | ICD-10-CM

## 2012-10-27 ENCOUNTER — Ambulatory Visit (INDEPENDENT_AMBULATORY_CARE_PROVIDER_SITE_OTHER): Payer: BC Managed Care – PPO | Admitting: Psychology

## 2012-11-18 ENCOUNTER — Ambulatory Visit (INDEPENDENT_AMBULATORY_CARE_PROVIDER_SITE_OTHER): Payer: BC Managed Care – PPO | Admitting: Psychology

## 2012-11-18 DIAGNOSIS — F331 Major depressive disorder, recurrent, moderate: Secondary | ICD-10-CM

## 2012-12-16 ENCOUNTER — Ambulatory Visit: Payer: Self-pay | Admitting: Family Medicine

## 2012-12-23 ENCOUNTER — Ambulatory Visit: Payer: BC Managed Care – PPO | Admitting: Family Medicine

## 2012-12-23 ENCOUNTER — Ambulatory Visit (INDEPENDENT_AMBULATORY_CARE_PROVIDER_SITE_OTHER): Payer: BC Managed Care – PPO | Admitting: Psychology

## 2012-12-23 DIAGNOSIS — F331 Major depressive disorder, recurrent, moderate: Secondary | ICD-10-CM

## 2012-12-28 ENCOUNTER — Ambulatory Visit: Payer: BC Managed Care – PPO | Admitting: Family Medicine

## 2012-12-28 ENCOUNTER — Ambulatory Visit (INDEPENDENT_AMBULATORY_CARE_PROVIDER_SITE_OTHER): Payer: BC Managed Care – PPO | Admitting: Family Medicine

## 2012-12-28 ENCOUNTER — Encounter: Payer: Self-pay | Admitting: Family Medicine

## 2012-12-28 VITALS — BP 130/92 | HR 95 | Temp 98.2°F | Ht 72.0 in | Wt 278.5 lb

## 2012-12-28 DIAGNOSIS — Z13220 Encounter for screening for lipoid disorders: Secondary | ICD-10-CM

## 2012-12-28 DIAGNOSIS — F518 Other sleep disorders not due to a substance or known physiological condition: Secondary | ICD-10-CM

## 2012-12-28 DIAGNOSIS — R609 Edema, unspecified: Secondary | ICD-10-CM

## 2012-12-28 DIAGNOSIS — R0600 Dyspnea, unspecified: Secondary | ICD-10-CM

## 2012-12-28 DIAGNOSIS — Z72821 Inadequate sleep hygiene: Secondary | ICD-10-CM

## 2012-12-28 DIAGNOSIS — G479 Sleep disorder, unspecified: Secondary | ICD-10-CM

## 2012-12-28 DIAGNOSIS — R0989 Other specified symptoms and signs involving the circulatory and respiratory systems: Secondary | ICD-10-CM

## 2012-12-28 DIAGNOSIS — Z0000 Encounter for general adult medical examination without abnormal findings: Secondary | ICD-10-CM

## 2012-12-28 LAB — CBC WITH DIFFERENTIAL/PLATELET
Eosinophils Relative: 5.2 % — ABNORMAL HIGH (ref 0.0–5.0)
HCT: 42.5 % (ref 39.0–52.0)
Hemoglobin: 14.6 g/dL (ref 13.0–17.0)
Lymphs Abs: 2.1 10*3/uL (ref 0.7–4.0)
MCV: 87.4 fl (ref 78.0–100.0)
Monocytes Absolute: 0.4 10*3/uL (ref 0.1–1.0)
Neutro Abs: 3.4 10*3/uL (ref 1.4–7.7)
Platelets: 281 10*3/uL (ref 150.0–400.0)
WBC: 6.2 10*3/uL (ref 4.5–10.5)

## 2012-12-28 LAB — HEPATIC FUNCTION PANEL
ALT: 41 U/L (ref 0–53)
AST: 29 U/L (ref 0–37)
Albumin: 3.9 g/dL (ref 3.5–5.2)
Alkaline Phosphatase: 103 U/L (ref 39–117)
Bilirubin, Direct: 0 mg/dL (ref 0.0–0.3)
Total Bilirubin: 0.6 mg/dL (ref 0.3–1.2)
Total Protein: 6.8 g/dL (ref 6.0–8.3)

## 2012-12-28 LAB — BASIC METABOLIC PANEL
BUN: 20 mg/dL (ref 6–23)
Chloride: 106 mEq/L (ref 96–112)
Glucose, Bld: 130 mg/dL — ABNORMAL HIGH (ref 70–99)
Potassium: 4.4 mEq/L (ref 3.5–5.1)

## 2012-12-28 LAB — LDL CHOLESTEROL, DIRECT: Direct LDL: 88.6 mg/dL
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
## 2013-01-05 ENCOUNTER — Ambulatory Visit (HOSPITAL_BASED_OUTPATIENT_CLINIC_OR_DEPARTMENT_OTHER): Payer: BC Managed Care – PPO

## 2013-01-07 ENCOUNTER — Ambulatory Visit (HOSPITAL_BASED_OUTPATIENT_CLINIC_OR_DEPARTMENT_OTHER): Payer: BC Managed Care – PPO | Attending: Family Medicine | Admitting: Radiology

## 2013-01-07 VITALS — Ht 72.0 in | Wt 275.0 lb

## 2013-01-07 DIAGNOSIS — G479 Sleep disorder, unspecified: Secondary | ICD-10-CM

## 2013-01-07 DIAGNOSIS — G4733 Obstructive sleep apnea (adult) (pediatric): Secondary | ICD-10-CM | POA: Insufficient documentation

## 2013-01-07 DIAGNOSIS — Z72821 Inadequate sleep hygiene: Secondary | ICD-10-CM

## 2013-01-12 DIAGNOSIS — G4730 Sleep apnea, unspecified: Secondary | ICD-10-CM

## 2013-01-12 DIAGNOSIS — G4710 Hypersomnia, unspecified: Secondary | ICD-10-CM

## 2013-01-13 NOTE — Procedures (Signed)
NAMECLEARNCE, LEJA NO.:  [id number]  MEDICAL RECORD NO.:  [id number]          PATIENT TYPE:  OUT  LOCATION:  SLEEP CENTER                 FACILITY:  [facility]  PHYSICIAN:  Barbaraann Share, MD,FCCPDATE OF BIRTH:  20-Jan-1973  DATE OF STUDY:  01/07/2013                           NOCTURNAL POLYSOMNOGRAM  REFERRING PHYSICIAN:  Juleen China, MD  INDICATION FOR STUDY:  Hypersomnia with sleep apnea.  EPWORTH SLEEPINESS SCORE:  23.  MEDICATIONS:  SLEEP ARCHITECTURE:  The patient had a total sleep time of 364 minutes with no slow-wave sleep and only 73 minutes of REM.  Sleep onset latency was normal at 22 minutes and REM onset was very rapid at 27 minutes. Sleep efficiency was mildly reduced at 88%.  RESPIRATORY DATA:  The patient was found to have 546 apneas and 15 obstructive hypopneas, giving him an apnea/hypopnea index of 93 events per hour.  The events occurred in all body positions, and there was loud snoring noted throughout.  OXYGEN DATA:  There was O2 desaturation as low as 72% with the patient's obstructive events.  CARDIAC DATA:  There were no clinically significant arrhythmias noted throughout the night.  MOVEMENT-PARASOMNIA:  The patient had no significant leg jerks or other abnormal behaviors noted.  IMPRESSIONS-RECOMMENDATIONS:  Very severe obstructive sleep apnea/hypopnea syndrome, with an AHI of 93 events per hour and oxygen desaturation as low as 72%.  Treatment for this degree of sleep apnea should focus primarily on CPAP as well as weight loss.     Barbaraann Share, MD,FCCP Diplomate, American Board of Sleep Medicine    [facility]MED[facility]  D:  01/12/2013 08:51:23  T:  01/13/2013 00:44:50  Job:  [id number]

## 2013-01-15 ENCOUNTER — Telehealth: Payer: Self-pay

## 2013-01-15 NOTE — Telephone Encounter (Signed)
Pt called requesting sleep study results; pt said was told when had sleep study could take up to 2 weeks to get report and pt is still having problems sleeping and wants results expedited if possible.Please advise.

## 2013-01-18 ENCOUNTER — Other Ambulatory Visit: Payer: Self-pay | Admitting: Family Medicine

## 2013-01-18 DIAGNOSIS — G4733 Obstructive sleep apnea (adult) (pediatric): Secondary | ICD-10-CM

## 2013-01-18 NOTE — Telephone Encounter (Signed)
Discussed with him on the phone   Hannah Beat, MD 01/18/2013, 2:06 PM

## 2013-01-19 ENCOUNTER — Telehealth: Payer: Self-pay | Admitting: Pulmonary Disease

## 2013-01-19 NOTE — Telephone Encounter (Signed)
Alexander [name], this pt needs new sleep consult with me asap.  We can either double book at 9am or have him come at 430.  See what's available and let me know.  Thanks.

## 2013-01-19 NOTE — Telephone Encounter (Signed)
Pt scheduled for consult appt with [facility] THURS 5/22 at [facility] per [facility] to schedule in 15 min slot.  Pt aware to arrive 15-8mins early to fill out paperwork.  SLEEP CONSULT

## 2013-01-19 NOTE — Telephone Encounter (Addendum)
On tomorrows schedule, You have a 330p CONSULT scheduled and a 400p slot open with a 415 OV after---would you like to book at the 400p or double book at 9am?

## 2013-01-19 NOTE — Telephone Encounter (Signed)
Patient cannot do 400p slot d/t work schedule...goes into work at 3:30. Will ask Dr Shelle Iron which 9am slot he would like to use...9am double book tomorrow 5/21 or 9am open slot 5/22

## 2013-01-19 NOTE — Telephone Encounter (Signed)
Book at the 4pm slot.  Thanks.

## 2013-01-21 ENCOUNTER — Ambulatory Visit (INDEPENDENT_AMBULATORY_CARE_PROVIDER_SITE_OTHER): Payer: BC Managed Care – PPO | Admitting: Pulmonary Disease

## 2013-01-21 ENCOUNTER — Encounter: Payer: Self-pay | Admitting: Pulmonary Disease

## 2013-01-21 VITALS — BP 138/82 | HR 103 | Temp 97.6°F | Ht 72.5 in | Wt 278.8 lb

## 2013-01-21 DIAGNOSIS — G4733 Obstructive sleep apnea (adult) (pediatric): Secondary | ICD-10-CM

## 2013-01-21 NOTE — Assessment & Plan Note (Signed)
The patient has been found to have severe obstructive sleep apnea, superimposed over his sleep hygiene and insomnia issues.  I have reviewed the pathophysiology of sleep apnea with him, including its impact to his quality of life and cardiovascular health.  He will need to be started on CPAP, while working on weight loss.  The patient is agreeable to this approach. I will set the patient up on cpap at a moderate pressure level to allow for desensitization, and will troubleshoot the device over the next 4-6weeks if needed.  The pt is to call me if having issues with tolerance.  Will then optimize the pressure once patient is able to wear cpap on a consistent basis.
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
## 2013-01-21 NOTE — Patient Instructions (Addendum)
Will start on cpap at moderate level.  Please call if having issues with tolerance. Work on weight loss followup with me in 6 weeks.  

## 2013-03-09 ENCOUNTER — Ambulatory Visit (INDEPENDENT_AMBULATORY_CARE_PROVIDER_SITE_OTHER): Payer: BC Managed Care – PPO | Admitting: Family Medicine

## 2013-03-09 ENCOUNTER — Ambulatory Visit (INDEPENDENT_AMBULATORY_CARE_PROVIDER_SITE_OTHER): Payer: BC Managed Care – PPO | Admitting: Pulmonary Disease

## 2013-03-09 ENCOUNTER — Encounter: Payer: Self-pay | Admitting: Family Medicine

## 2013-03-09 ENCOUNTER — Encounter: Payer: Self-pay | Admitting: Pulmonary Disease

## 2013-03-09 VITALS — BP 152/88 | HR 94 | Temp 98.0°F | Ht 72.0 in | Wt 287.0 lb

## 2013-03-09 VITALS — BP 130/80 | HR 79 | Temp 98.1°F | Ht 72.0 in | Wt 284.8 lb

## 2013-03-09 DIAGNOSIS — R609 Edema, unspecified: Secondary | ICD-10-CM

## 2013-03-09 DIAGNOSIS — G4733 Obstructive sleep apnea (adult) (pediatric): Secondary | ICD-10-CM

## 2013-03-09 DIAGNOSIS — R0989 Other specified symptoms and signs involving the circulatory and respiratory systems: Secondary | ICD-10-CM

## 2013-03-09 DIAGNOSIS — R0600 Dyspnea, unspecified: Secondary | ICD-10-CM

## 2013-03-09 LAB — URINALYSIS
Hgb urine dipstick: NEGATIVE
Leukocytes, UA: NEGATIVE
Nitrite: NEGATIVE
Protein, ur: NEGATIVE mg/dL

## 2013-03-09 MED ORDER — HYDROCHLOROTHIAZIDE 12.5 MG PO TABS: 12.5000 mg | ORAL_TABLET | Freq: Every day | ORAL | Status: DC

## 2013-03-09 NOTE — Patient Instructions (Addendum)
REFERRAL: GO THE THE FRONT ROOM AT THE ENTRANCE OF OUR CLINIC, NEAR CHECK IN. ASK FOR Alexander [name]. SHE WILL HELP YOU SET UP YOUR REFERRAL. DATE: TIME:  

## 2013-03-09 NOTE — Assessment & Plan Note (Signed)
The patient is doing very well on CPAP since the last visit, and is wearing every night his download.  He is having no mask or pressure issues.  He is only wearing the device about 4-5 hours per night because of his work schedule, and I've asked him to try to extend this is much as possible.  We also need to optimize his pressure.  Finally, I have encouraged him to work aggressively on weight loss.

## 2013-03-09 NOTE — Patient Instructions (Addendum)
Will optimize your pressure on the automatic setting for a few weeks, and let you know the results. Continue to work on weight loss followup with me in 6mos, but call if having issues with cpap tolerance.

## 2013-03-09 NOTE — Addendum Note (Signed)
Addended by: Baldomero Lamy on: 03/09/2013 02:28 PM   Modules accepted: Orders

## 2013-03-09 NOTE — Progress Notes (Signed)
  Subjective:    Patient ID: Alexander [name], male    DOB: 24-Mar-1971, 42 y.o.   MRN: [id number]  HPI Patient comes in today for followup of his obstructive sleep apnea.  He was started on CPAP at the last visit, and has done very well with the device.  His download shows excellent compliance, but is only wearing about 4-5 hours a night because of his sleep schedule with work.  He has no significant mask leak, and his AHI appears to be fairly well controlled.  The patient has seen a significant improvement in his sleep and daytime alertness.   Review of Systems  Constitutional: Negative for fever and unexpected weight change.  HENT: Negative for ear pain, nosebleeds, congestion, sore throat, rhinorrhea, sneezing, trouble swallowing, dental problem, postnasal drip and sinus pressure.   Eyes: Negative for redness and itching.  Respiratory: Negative for cough, chest tightness, shortness of breath and wheezing.   Cardiovascular: Negative for palpitations and leg swelling.  Gastrointestinal: Negative for nausea and vomiting.  Genitourinary: Negative for dysuria.  Musculoskeletal: Negative for joint swelling.  Skin: Negative for rash.  Neurological: Negative for headaches.  Hematological: Does not bruise/bleed easily.  Psychiatric/Behavioral: Negative for dysphoric mood. The patient is not nervous/anxious.        Objective:   Physical Exam Obese male in no acute distress Nose without purulence or discharge noted No skin breakdown or pressure necrosis from the CPAP mask Neck without lymphadenopathy or thyromegaly Lower extremities with mild ankle edema, no cyanosis Alert, does not appear to be sleepy, moves all 4 extremities.       Assessment & Plan:

## 2013-03-09 NOTE — Progress Notes (Signed)
Nature conservation officer at [facility] [address] [place] [address] Phone: [phone] Fax: [phone]  Date:  03/09/2013   Name:  Alexander [name]   DOB:  March 30, 1971   MRN:  [id number] Gender: male Age: 42 y.o.  Primary Physician:  Hannah Beat, MD  Evaluating MD: Hannah Beat, MD  Chief Complaint: Foot Swelling   History of Present Illness:  Alexander [name] is a 42 y.o. very pleasant male patient who presents with the following:  [place] the beach [place], got really puffy.  Averaging 4 hours of sleep a night.  B significant LE swelling diffusely  I evaluated the patient approximately 3 months ago, and at that time he was having some lower extremity edema it was mild at that time. Recheck in insurance that he had a normal renal function, normal blood sugar, and normal hepatic function. He also at that point had a normal cardiac BNP. The patient also does have severe sleep apnea, and was recently started on CPAP, with some good success, but he is only wearing this about 4 hours a night.  Past Medical History, Surgical History, Social History, Family History, Problem List, Medications, and Allergies have been reviewed and updated if relevant.  No current outpatient prescriptions on file prior to visit.   No current facility-administered medications on file prior to visit.    Review of Systems: 13 pound weight gain in the last 2 weeks. No chest pain. Shortness of breath with mild exertion. No fever or chills.  Physical Examination: BP 130/80  Pulse 79  Temp(Src) 98.1 F (36.7 C) (Oral)  Ht 6' (1.829 m)  Wt 284 lb 12 oz (129.162 kg)  BMI 38.61 kg/m2  SpO2 96%   GEN: WDWN, NAD, Non-toxic, A & O x 3 HEENT: Atraumatic, Normocephalic. Neck supple. No masses, No LAD. Ears and Nose: No external deformity. CV: RRR, No M/G/R. No JVD. No thrill. No extra heart sounds. PULM: CTA B, no wheezes, crackles, rhonchi. No retractions. No resp. distress. No accessory muscle  use. EXTR: No c/c. 2+ B LE edema and 3+ pedal edema.  NEURO Normal gait.  PSYCH: Normally interactive. Conversant. Not depressed or anxious appearing.  Calm demeanor.    Assessment and Plan:  Dependent edema - Plan: 2D Echocardiogram without contrast  Dyspnea - Plan: 2D Echocardiogram without contrast  Fairly remarkable lower extremity edema in a patient who is 42 years old and also has severe sleep apnea. We are going to go ahead and get a echocardiogram to evaluate for right heart failure and potentially pulmonary hypertension.  Obtain urinalysis to ensure no nephrotic syndrome.  Comprehensive Metabolic Panel:    Component Value Date/Time   NA 140 12/28/2012 1202   K 4.4 12/28/2012 1202   CL 106 12/28/2012 1202   CO2 29 12/28/2012 1202   BUN 20 12/28/2012 1202   CREATININE 0.9 12/28/2012 1202   GLUCOSE 130* 12/28/2012 1202   CALCIUM 9.1 12/28/2012 1202   AST 29 12/28/2012 1202   ALT 41 12/28/2012 1202   ALKPHOS 103 12/28/2012 1202   BILITOT 0.6 12/28/2012 1202   PROT 6.8 12/28/2012 1202   ALBUMIN 3.9 12/28/2012 1202     BNP    Component Value Date/Time   PROBNP 24.0 12/28/2012 1202     Orders Today:  Orders Placed This Encounter  Procedures  . 2D Echocardiogram without contrast    Standing Status: Future     Number of Occurrences:      Standing Expiration Date:  03/09/2014    Order Specific Question:  Type of Echo    Answer:  Complete    Order Specific Question:  Reason for Exam    Answer:  severe worsening edema.    Order Specific Question:  Where should this test be performed    Answer:  [facility] [place]    Updated Medication List: (Includes new medications, updates to list, dose adjustments) Meds ordered this encounter  Medications  . hydrochlorothiazide (HYDRODIURIL) 12.5 MG tablet    Sig: Take 1 tablet (12.5 mg total) by mouth daily.    Dispense:  30 tablet    Refill:  1    Medications Discontinued: There are no discontinued medications.     Signed, Elpidio Galea. Bennett Vanscyoc, MD 03/09/2013 12:54 PM

## 2013-03-10 LAB — URINALYSIS, MICROSCOPIC ONLY
Casts: NONE SEEN
Squamous Epithelial / LPF: NONE SEEN

## 2013-03-30 ENCOUNTER — Other Ambulatory Visit: Payer: BC Managed Care – PPO

## 2013-04-06 ENCOUNTER — Other Ambulatory Visit: Payer: Self-pay

## 2013-04-06 ENCOUNTER — Other Ambulatory Visit (INDEPENDENT_AMBULATORY_CARE_PROVIDER_SITE_OTHER): Payer: BC Managed Care – PPO

## 2013-04-06 DIAGNOSIS — R0609 Other forms of dyspnea: Secondary | ICD-10-CM

## 2013-04-06 DIAGNOSIS — R609 Edema, unspecified: Secondary | ICD-10-CM

## 2013-04-06 DIAGNOSIS — R0600 Dyspnea, unspecified: Secondary | ICD-10-CM

## 2013-04-09 ENCOUNTER — Telehealth: Payer: Self-pay

## 2013-04-09 NOTE — Telephone Encounter (Signed)
Notify pt that echo is completely nml, good heart squeeze and filling.  Other eval of swelling has been nml per my review.  Recommend adding compression hose 15-30 mmHG during day when on feet.  Dr. Patsy Lager will review when he returns and will make any further eval recommendations.

## 2013-04-09 NOTE — Telephone Encounter (Signed)
Pt called to get echo report, pt said his hands are still numb and tingling, bottom of feet hurt, and pt has slight improvement in swelling in feet and ankles but if pt pushes thumb in on foot and indention remains. Pt said when keeps feet up over night maybe 50 % improvement in swelling in AM but after up on feet for 1-2 hours swelling is back. Pt said cannot keep feet up during the day when at work. Since starting HCTZ pt has lost 7 lbs.[facility].pt request cb.

## 2013-04-09 NOTE — Telephone Encounter (Signed)
Advised patient as instructed. He just wanted to know that his echo was normal.

## 2013-04-14 NOTE — Telephone Encounter (Signed)
i will also talk to him

## 2013-04-27 ENCOUNTER — Encounter: Payer: Self-pay | Admitting: *Deleted

## 2013-09-07 ENCOUNTER — Ambulatory Visit: Payer: BC Managed Care – PPO | Admitting: Pulmonary Disease

## 2018-04-10 ENCOUNTER — Other Ambulatory Visit: Payer: Self-pay

## 2018-04-10 ENCOUNTER — Emergency Department
Admission: EM | Admit: 2018-04-10 | Discharge: 2018-04-10 | Disposition: A | Discharge status: Home / Self Care | Payer: Self-pay | Attending: Emergency Medicine | Admitting: Emergency Medicine

## 2018-04-10 ENCOUNTER — Emergency Department: Payer: Self-pay

## 2018-04-10 DIAGNOSIS — F17210 Nicotine dependence, cigarettes, uncomplicated: Secondary | ICD-10-CM | POA: Insufficient documentation

## 2018-04-10 DIAGNOSIS — E1165 Type 2 diabetes mellitus with hyperglycemia: Secondary | ICD-10-CM | POA: Insufficient documentation

## 2018-04-10 DIAGNOSIS — Z79899 Other long term (current) drug therapy: Secondary | ICD-10-CM | POA: Insufficient documentation

## 2018-04-10 LAB — URINALYSIS, COMPLETE (UACMP) WITH MICROSCOPIC
Bacteria, UA: NONE SEEN
Bilirubin Urine: NEGATIVE
Ketones, ur: 20 mg/dL — AB
Leukocytes, UA: NEGATIVE
Nitrite: NEGATIVE
Protein, ur: NEGATIVE mg/dL
SPECIFIC GRAVITY, URINE: 1.028 (ref 1.005–1.030)
pH: 6 (ref 5.0–8.0)

## 2018-04-10 LAB — BASIC METABOLIC PANEL
Anion gap: 12 (ref 5–15)
BUN: 20 mg/dL (ref 6–20)
CALCIUM: 8.8 mg/dL — AB (ref 8.9–10.3)
CO2: 22 mmol/L (ref 22–32)
CREATININE: 1.09 mg/dL (ref 0.61–1.24)
Chloride: 96 mmol/L — ABNORMAL LOW (ref 98–111)
GFR calc Af Amer: >60 mL/min (ref >60)
GLUCOSE: 560 mg/dL — AB (ref 70–99)
Potassium: 4.2 mmol/L (ref 3.5–5.1)
Sodium: 130 mmol/L — ABNORMAL LOW (ref 135–145)

## 2018-04-10 LAB — CBC
HCT: 44.5 % (ref 40.0–52.0)
Hemoglobin: 15.7 g/dL (ref 13.0–18.0)
MCH: 30.8 pg (ref 26.0–34.0)
MCHC: 35.2 g/dL (ref 32.0–36.0)
MCV: 87.3 fL (ref 80.0–100.0)
Platelets: 245 10*3/uL (ref 150–440)
RBC: 5.1 MIL/uL (ref 4.40–5.90)
RDW: 12.5 % (ref 11.5–14.5)
WBC: 9.5 10*3/uL (ref 3.8–10.6)

## 2018-04-10 LAB — GLUCOSE, CAPILLARY
GLUCOSE-CAPILLARY: 533 mg/dL — AB (ref 70–99)
GLUCOSE-CAPILLARY: 341 mg/dL — AB (ref 70–99)
Glucose-Capillary: 455 mg/dL — ABNORMAL HIGH (ref 70–99)

## 2018-04-10 LAB — TROPONIN I: Troponin I: <0.03 ng/mL (ref <0.03)

## 2018-04-10 MED ORDER — SODIUM CHLORIDE 0.9 % IV SOLN
Freq: Once | INTRAVENOUS | Status: AC
  Administered 2018-04-10: 1000 mL via INTRAVENOUS

## 2018-04-10 MED ORDER — METFORMIN HCL 500 MG PO TABS: 500.0000 mg | ORAL_TABLET | Freq: Two times a day (BID) | ORAL | 11 refills | Status: DC

## 2018-04-10 MED ORDER — METFORMIN HCL 500 MG PO TABS
500.0000 mg | ORAL_TABLET | Freq: Once | ORAL | Status: AC
  Administered 2018-04-10: 500 mg via ORAL
  Filled 2018-04-10: qty 1

## 2018-04-10 MED ORDER — SODIUM CHLORIDE 0.9 % IV BOLUS
1000.0000 mL | Freq: Once | INTRAVENOUS | Status: AC
  Administered 2018-04-10: 1000 mL via INTRAVENOUS

## 2018-04-10 NOTE — ED Triage Notes (Signed)
To ER via POV c/o urinary frequency, excessive thirst, and blurry vision X 1 month. Intermittent CP over last few days. Pt alert and oriented X4, active, cooperative, pt in NAD. RR even and unlabored, color WNL.

## 2018-04-10 NOTE — ED Provider Notes (Signed)
[facility] Emergency Department Provider Note  ____________________________________________   None    (approximate)  I have reviewed the triage vital signs and the nursing notes.   HISTORY  Chief Complaint Polydipsia; Urinary Frequency; and Blurred Vision   HPI Alexander [name] is a 47 y.o. male who presents to the emergency department for treatment and evaluation of urinary frequency, excessive thirst, blurry vision, and intermittent chest pain over the past few days.  Patient states that his PCP told him a few years ago that he was "prediabetic."  He reports having a family history of type 2 diabetes and believes that this is the reason for his symptoms.  Past Medical History:  Diagnosis Date  . Bipolar 1 disorder (HCC)   . Herniated cervical disc    C6  . Neck pain   . Obesity   . Pollen allergies     Patient Active Problem List   Diagnosis Date Noted  . Obstructive sleep apnea 01/18/2013  . Family history of colon cancer 12/11/2011  . Inadequate sleep hygiene 12/09/2011  . Bipolar 1 disorder (HCC) 03/29/2010  . OBESITY 11/08/2008  . ALLERGIC RHINITIS 11/08/2008  . NECK PAIN, CHRONIC 11/08/2008  . FATIGUE 11/08/2008    Past Surgical History:  Procedure Laterality Date  . VASECTOMY      Prior to Admission medications   Medication Sig Start Date End Date Taking? Authorizing Provider  hydrochlorothiazide (HYDRODIURIL) 12.5 MG tablet Take 1 tablet (12.5 mg total) by mouth daily. 03/09/13   Copland, Karleen Hampshire, MD  metFORMIN (GLUCOPHAGE) 500 MG tablet Take 1 tablet (500 mg total) by mouth 2 (two) times daily with a meal. 04/10/18 04/10/19  Briseis Aguilera, Rulon Eisenmenger B, FNP    Allergies Patient has no known allergies.  Family History  Problem Relation Age of Onset  . Colon cancer Mother 18  . Breast cancer Mother 109    Social History Social History   Tobacco Use  . Smoking status: Current Every Day Smoker    Packs/day: 0.10    Years: 3.00    Pack  years: 0.30    Types: Cigarettes    Last attempt to quit: 09/02/1996    Years since quitting: 21.6  . Smokeless tobacco: Never Used  . Tobacco comment: only smoked socially through college.  Substance Use Topics  . Alcohol use: Yes    Comment: 2 beers a week  . Drug use: No    Comment: OCCASIONAL MJ    Review of Systems  Constitutional: No fever/chills.  Positive for polydipsia. Eyes: No visual changes. ENT: No sore throat. Cardiovascular: Occasional chest pain. Respiratory: Denies shortness of breath. Gastrointestinal: No abdominal pain.  No nausea, no vomiting.  No diarrhea.  No constipation. Genitourinary: Negative for dysuria.  Positive for polyuria Musculoskeletal: Negative for back pain. Skin: Negative for rash. Neurological: Negative for headaches, focal weakness or numbness.  ____________________________________________   PHYSICAL EXAM:  VITAL SIGNS: ED Triage Vitals  Enc Vitals Group     BP 04/10/18 1743 (!) 149/79     Pulse Rate 04/10/18 1743 92     Resp 04/10/18 1743 18     Temp 04/10/18 1743 98.8 F (37.1 C)     Temp Source 04/10/18 1743 Oral     SpO2 04/10/18 1743 95 %     Weight 04/10/18 1744 267 lb (121.1 kg)     Height 04/10/18 1744 6' (1.829 m)     Head Circumference --      Peak Flow --  Pain Score 04/10/18 1744 0     Pain Loc --      Pain Edu? --      Excl. in GC? --     Constitutional: Alert and oriented. Well appearing and in no acute distress. Eyes: Conjunctivae are normal. Head: Atraumatic. Nose: No congestion/rhinnorhea. Mouth/Throat: Mucous membranes are moist.  Oropharynx non-erythematous. Neck: No stridor.   Cardiovascular: Normal rate, regular rhythm. Grossly normal heart sounds.  Good peripheral circulation. Respiratory: Normal respiratory effort.  No retractions. Lungs CTAB. Gastrointestinal: Soft and nontender. No distention. No abdominal bruits. No CVA tenderness. Musculoskeletal: No lower extremity tenderness nor edema.   No joint effusions. Neurologic:  Normal speech and language. No gross focal neurologic deficits are appreciated. No gait instability. Skin:  Skin is warm, dry and intact. No rash noted. Psychiatric: Mood and affect are normal. Speech and behavior are normal.  ____________________________________________   LABS (all labs ordered are listed, but only abnormal results are displayed)  Labs Reviewed  BASIC METABOLIC PANEL - Abnormal; Notable for the following components:      Result Value   Sodium 130 (*)    Chloride 96 (*)    Glucose, Bld 560 (*)    Calcium 8.8 (*)    All other components within normal limits  URINALYSIS, COMPLETE (UACMP) WITH MICROSCOPIC - Abnormal; Notable for the following components:   Color, Urine STRAW (*)    APPearance CLEAR (*)    Glucose, UA >=500 (*)    Hgb urine dipstick SMALL (*)    Ketones, ur 20 (*)    All other components within normal limits  GLUCOSE, CAPILLARY - Abnormal; Notable for the following components:   Glucose-Capillary 533 (*)    All other components within normal limits  GLUCOSE, CAPILLARY - Abnormal; Notable for the following components:   Glucose-Capillary 455 (*)    All other components within normal limits  GLUCOSE, CAPILLARY - Abnormal; Notable for the following components:   Glucose-Capillary 341 (*)    All other components within normal limits  CBC  TROPONIN I  CBG MONITORING, ED  CBG MONITORING, ED   ____________________________________________  EKG  ED ECG REPORT I, Katalia Choma personally viewed and interpreted this ECG.   Date: 04/10/2018  EKG Time: 5:48 PM  Rate: Regular, 85  Rhythm: normal EKG, normal sinus rhythm  Axis: Mild left axis deviation  Intervals:none  ST&T Change: No ST elevation  ____________________________________________  RADIOLOGY  ED MD interpretation: Chest x-ray negative for acute cardiopulmonary abnormality per radiology.  Official radiology report(s): Dg Chest 2 View  Result  Date: 04/10/2018 CLINICAL DATA:  Chest pain. Increased urinary frequency and excessive thirst. Blurry vision. EXAM: CHEST - 2 VIEW COMPARISON:  None. FINDINGS: The heart size and mediastinal contours are within normal limits. Both lungs are clear. The visualized skeletal structures are unremarkable. IMPRESSION: No active cardiopulmonary disease. Electronically Signed   By: Marin Roberts M.D.   On: 04/10/2018 18:25    ____________________________________________   PROCEDURES  Procedure(s) performed: None  Procedures  Critical Care performed: No  ____________________________________________   INITIAL IMPRESSION / ASSESSMENT AND PLAN / ED COURSE  As part of my medical decision making, I reviewed the following data within the electronic MEDICAL RECORD NUMBER    47 year old male presenting to the emergency department for treatment and evaluation of symptoms and exam most consistent with new onset type 2 diabetes.  Labs confirm the same.  No indication of DKA.  After 1 L of normal saline, the glucose decreased  to 455.  A second liter has been requested.  He will also be given a dose of metformin.  We have discussed at length the lifestyle changes and medications that will be required.  Patient states that he is familiar with some of these changes as he has a good friend who is diabetic.  Patient states that at this time he does not have any insurance and is concerned about being able to afford testing and visits.  [facility] will be given [facility] and [facility].  ----------------------------------------- 9:03 PM on 04/10/2018 -----------------------------------------  Patient discharged home after glucose came down to 341. He was advised to establish a primary care provider as soon as possible and to return to the ER for symptoms of concern if unable to schedule an appointment.  __________________________________________   FINAL CLINICAL  IMPRESSION(S) / ED DIAGNOSES  Final diagnoses:  Uncontrolled type 2 diabetes mellitus with hyperglycemia [facility](HCC)     ED Discharge Orders         Ordered    metFORMIN (GLUCOPHAGE) 500 MG tablet  2 times daily with meals     04/10/18 2047           Note:  This document was prepared using Dragon voice recognition software and may include unintentional dictation errors.    Chinita Pesterriplett, Deseray Daponte B, FNP 04/10/18 2105    Phineas SemenGoodman, Graydon, MD 04/10/18 2112

## 2018-04-10 NOTE — ED Provider Notes (Signed)
Lurline IdolI, Assia Meanor, attending physician, personally viewed and interpreted this EKG  EKG Time: 1748 Rate: 85 Rhythm: normal sinus rhythm Axis: normal Intervals: qtc 423 QRS: narrow ST changes: no st elevation Impression: normal Maryan Pulsekg    Darriona Dehaas, MD 04/10/18 [phone]

## 2018-04-10 NOTE — Discharge Instructions (Signed)
Please establish a primary care provider as soon as possible. Take your medication as prescribed. Read the information regarding foods you should avoid. Return to the ER for symptoms of concern if unable to schedule an appointment.

## 2018-04-29 ENCOUNTER — Telehealth: Payer: Self-pay

## 2018-04-29 NOTE — Telephone Encounter (Signed)
Called and gave eligibility requirements. PT to bring paperwork tmw.

## 2018-05-14 ENCOUNTER — Ambulatory Visit: Payer: Self-pay

## 2018-05-28 ENCOUNTER — Ambulatory Visit: Payer: Self-pay | Admitting: Adult Health

## 2018-06-04 ENCOUNTER — Other Ambulatory Visit: Payer: Self-pay

## 2018-06-04 ENCOUNTER — Encounter: Payer: Self-pay | Admitting: Adult Health

## 2018-06-04 ENCOUNTER — Ambulatory Visit: Payer: Self-pay | Admitting: Adult Health

## 2018-06-04 VITALS — BP 132/82 | Temp 97.6°F | Ht 72.0 in | Wt 257.9 lb

## 2018-06-04 DIAGNOSIS — Z0000 Encounter for general adult medical examination without abnormal findings: Secondary | ICD-10-CM

## 2018-06-04 DIAGNOSIS — E6609 Other obesity due to excess calories: Secondary | ICD-10-CM

## 2018-06-04 DIAGNOSIS — N521 Erectile dysfunction due to diseases classified elsewhere: Secondary | ICD-10-CM

## 2018-06-04 DIAGNOSIS — E1140 Type 2 diabetes mellitus with diabetic neuropathy, unspecified: Secondary | ICD-10-CM

## 2018-06-04 DIAGNOSIS — Z6834 Body mass index (BMI) 34.0-34.9, adult: Secondary | ICD-10-CM

## 2018-06-04 DIAGNOSIS — G4733 Obstructive sleep apnea (adult) (pediatric): Secondary | ICD-10-CM

## 2018-06-04 DIAGNOSIS — F319 Bipolar disorder, unspecified: Secondary | ICD-10-CM

## 2018-06-04 DIAGNOSIS — E66811 Other obesity due to excess calories: Secondary | ICD-10-CM

## 2018-06-04 MED ORDER — GLUCOSE BLOOD VI STRP: ORAL_STRIP | 12 refills | Status: DC

## 2018-06-04 MED ORDER — TADALAFIL 10 MG PO TABS: 10.0000 mg | ORAL_TABLET | Freq: Every day | ORAL | 3 refills | Status: DC | PRN

## 2018-06-04 MED ORDER — LANCETS MISC: 1.0000 | Freq: Three times a day (TID) | 3 refills | Status: DC

## 2018-06-04 NOTE — Progress Notes (Signed)
Patient ID: Alexander [name], male   DOB: [birth year], 47 y.o.   MRN: [id number]  Chief Complaint  Patient presents with  . Diabetes    HPI Alexander [name] is a 46 y.o. male with T2DM, bipolar disorder, obesity and chronic pain who presents for an initial office visit and evaluation of chronic health problems. He was seen in the ED on 04/10/18 with severe hyperglycemia. He was treated with IV fluids, and metformin and discharged home with a prescription for metformin 1 gram ACB. He states that he has since decreased his metformin dose to 500 mg daily ACB. He monitors his blood sugars about 6 times a day. His blood glucose levels from 62 mg/dl before dinner and 326 mg/dl but have gradually improved since then and readings are now in the mid hundreds. Prior to this episode, he had been told that by his previous PCP that he had prediabetes. He lost weight and changed his diet and felt the prediabetes was cured. He has an extensive family history of DM. His blood glucose this morning was 151m/dl. He is c/o ED which he states has improved with glycemic control. He uses a CPAP at HS. Denies chest pain, palpitations, nausea and vomiting. He denies lower extremity pain or ulcers.    Past Medical History:  Diagnosis Date  . Bipolar 1 disorder ([place]   . Herniated cervical disc    C6  . Neck pain   . Obesity   . Pollen allergies     Past Surgical History:  Procedure Laterality Date  . VASECTOMY      Family History  Problem Relation Age of Onset  . Colon cancer Mother 641 . Breast cancer Mother 56   Social History Social History   Tobacco Use  . Smoking status: Current Every Day Smoker    Packs/day: 0.10    Years: 3.00    Pack years: 0.30    Types: Cigarettes    Last attempt to quit: 09/02/1996    Years since quitting: 21.7  . Smokeless tobacco: Never Used  . Tobacco comment: only smoked socially through college.  Substance Use Topics  . Alcohol use: Yes    Comment: 2 beers a week  . Drug  use: No    Comment: OCCASIONAL MJ    No Known Allergies  Current Outpatient Medications  Medication Sig Dispense Refill  . metFORMIN (GLUCOPHAGE) 500 MG tablet Take 1 tablet (500 mg total) by mouth 2 (two) times daily with a meal. (Patient taking differently: Take 500 mg by mouth daily with breakfast. ) 60 tablet 11  . hydrochlorothiazide (HYDRODIURIL) 12.5 MG tablet Take 1 tablet (12.5 mg total) by mouth daily. (Patient not taking: Reported on 06/04/2018) 30 tablet 1   No current facility-administered medications for this visit.     Review of Systems Review of Systems  Constitutional: Negative.   HENT: Negative.   Eyes: Negative.   Respiratory: Negative.   Cardiovascular: Negative for chest pain and leg swelling.  Gastrointestinal: Negative.   Endocrine: Negative for polydipsia, polyphagia and polyuria.  Genitourinary: Negative for dysuria and penile pain.       Reports ED  Musculoskeletal: Negative.   Skin: Negative.   Allergic/Immunologic: Negative.   Neurological: Negative.   Hematological: Negative.   Psychiatric/Behavioral: Negative for dysphoric mood and sleep disturbance. The patient is not hyperactive.     Blood pressure 132/82, temperature 97.6 F (36.4 C), height 6' (1.829 m), weight 257 lb 14.4 oz (117 kg).  Physical Exam  Physical Exam  Constitutional: He is oriented to person, place, and time. He appears well-developed and well-nourished.  HENT:  Head: Normocephalic and atraumatic.  Mouth/Throat: Oropharynx is clear and moist.  Eyes: Conjunctivae are normal. Right eye exhibits no discharge. Left eye exhibits no discharge.  Neck: No hepatojugular reflux and no JVD present. Carotid bruit is not present.  Cardiovascular: Normal rate, regular rhythm, normal heart sounds, intact distal pulses and normal pulses.  Pulmonary/Chest: Effort normal and breath sounds normal. No accessory muscle usage. No respiratory distress.  Abdominal: Soft. Bowel sounds are normal. He  exhibits distension (central obesity). There is no tenderness.  Genitourinary:  Genitourinary Comments: DEFERRED Alexander PATIENT'S PREFERENCE  Musculoskeletal: Normal range of motion.       Right shoulder: He exhibits normal range of motion and no deformity.       Right foot: There is no deformity.       Left foot: There is no deformity.  Feet:  Right Foot:  Skin Integrity: Negative for erythema.  Left Foot:  Skin Integrity: Negative for erythema.  Lymphadenopathy:    He has no cervical adenopathy.    He has no axillary adenopathy.  Neurological: He is alert and oriented to person, place, and time. He has normal strength. No cranial nerve deficit or sensory deficit.  Skin: Skin is warm and dry. Capillary refill takes less than 2 seconds.  Psychiatric: He has a normal mood and affect. His behavior is normal.  Nursing note and vitals reviewed.   Data Reviewed CMP, HgA1C, Lipid panel, TSH, magnesium and phosphate levels  Assessment and Plan 1. Bipolar 1 disorder ([place]) -Well controlled. Not currently on any medications. Sees a counselor/psychiatrist. Continue f/u as needed  2. Obstructive sleep apnea syndrome -Continue CPAP at HS  3. Class 1 obesity due to excess calories with serious comorbidity and body mass index (BMI) of 34.0 to 34.9 in adult -Will screen for CV risk factors and evaluate for optimal glycemic control -Comp Met (CMET) - Lipid Profile - HgB A1c - Magnesium - Phosphorus - TSH - Ambulatory referral to Ophthalmology - Urinalysis, Routine w reflex microscopic - Urine Microalbumin w/creat. ratio  4. Erectile disorder due to medical condition in male Due to T2DM. CIALIS PRN and continue glycemic control  5. Type 2 diabetes mellitus with diabetic neuropathy, without long-term current use of insulin (HCC) -Continue metformin. Will obtain Hg A1C  6. Health maintenance examination Life style modification reviewed. Continue glycemic control and weight loss  efforts Smoking cessation emphasized given his already CVD burden    Magddalene S Tukov-Yual 06/04/2018, 9:45 AM

## 2018-06-05 LAB — MICROALBUMIN / CREATININE URINE RATIO
CREATININE, UR: 112.2 mg/dL
Microalb/Creat Ratio: 26.4 mg/g creat (ref 0.0–30.0)
Microalbumin, Urine: 29.6 ug/mL

## 2018-06-05 LAB — LIPID PANEL
CHOLESTEROL TOTAL: 177 mg/dL (ref 100–199)
Chol/HDL Ratio: 5.7 ratio — ABNORMAL HIGH (ref 0.0–5.0)
HDL: 31 mg/dL — AB (ref >39)
LDL Calculated: 116 mg/dL — ABNORMAL HIGH (ref 0–99)
Triglycerides: 151 mg/dL — ABNORMAL HIGH (ref 0–149)
VLDL Cholesterol Cal: 30 mg/dL (ref 5–40)

## 2018-06-05 LAB — COMPREHENSIVE METABOLIC PANEL
A/G RATIO: 1.7 (ref 1.2–2.2)
ALK PHOS: 88 IU/L (ref 39–117)
ALT: 34 IU/L (ref 0–44)
AST: 20 IU/L (ref 0–40)
Albumin: 4.4 g/dL (ref 3.5–5.5)
BUN/Creatinine Ratio: 19 (ref 9–20)
BUN: 17 mg/dL (ref 6–24)
Bilirubin Total: 0.4 mg/dL (ref 0.0–1.2)
CALCIUM: 9.3 mg/dL (ref 8.7–10.2)
CHLORIDE: 104 mmol/L (ref 96–106)
CO2: 20 mmol/L (ref 20–29)
Creatinine, Ser: 0.88 mg/dL (ref 0.76–1.27)
GFR calc Af Amer: 121 mL/min/{1.73_m2} (ref >59)
GFR calc non Af Amer: 104 mL/min/{1.73_m2} (ref >59)
Globulin, Total: 2.6 g/dL (ref 1.5–4.5)
Glucose: 91 mg/dL (ref 65–99)
POTASSIUM: 4.9 mmol/L (ref 3.5–5.2)
Sodium: 140 mmol/L (ref 134–144)
Total Protein: 7 g/dL (ref 6.0–8.5)

## 2018-06-05 LAB — URINALYSIS, ROUTINE W REFLEX MICROSCOPIC
Bilirubin, UA: NEGATIVE
GLUCOSE, UA: NEGATIVE
Ketones, UA: NEGATIVE
Leukocytes, UA: NEGATIVE
Nitrite, UA: NEGATIVE
PROTEIN UA: NEGATIVE
RBC UA: NEGATIVE
Specific Gravity, UA: 1.021 (ref 1.005–1.030)
UUROB: 0.2 mg/dL (ref 0.2–1.0)
pH, UA: 5 (ref 5.0–7.5)

## 2018-06-05 LAB — PHOSPHORUS: PHOSPHORUS: 3.4 mg/dL (ref 2.5–4.5)

## 2018-06-05 LAB — MAGNESIUM: Magnesium: 2.2 mg/dL (ref 1.6–2.3)

## 2018-06-05 LAB — HEMOGLOBIN A1C
Est. average glucose Bld gHb Est-mCnc: 197 mg/dL
Hgb A1c MFr Bld: 8.5 % — ABNORMAL HIGH (ref 4.8–5.6)

## 2018-06-05 LAB — TSH: TSH: 1.61 u[IU]/mL (ref 0.450–4.500)

## 2018-06-13 ENCOUNTER — Encounter: Payer: Self-pay | Admitting: Adult Health

## 2018-06-16 ENCOUNTER — Other Ambulatory Visit: Payer: Self-pay | Admitting: Family Medicine

## 2018-06-16 DIAGNOSIS — E782 Mixed hyperlipidemia: Secondary | ICD-10-CM

## 2018-06-16 MED ORDER — ATORVASTATIN CALCIUM 10 MG PO TABS: 10.0000 mg | ORAL_TABLET | Freq: Every day | ORAL | 3 refills | Status: DC

## 2018-06-16 NOTE — Progress Notes (Signed)
Rx for Atorvastatin sent to pharmacy today.  

## 2018-06-17 ENCOUNTER — Telehealth: Payer: Self-pay

## 2018-06-17 NOTE — Telephone Encounter (Signed)
Left a vm for patient to callback 

## 2018-06-17 NOTE — Telephone Encounter (Signed)
-----   Message from Kallie Locks, FNP sent at 06/16/2018 10:40 PM EDT ----- Regarding: "Lab Results" Lyla Son,   Labs are stable. Cholesterol level is elevated. Rx sent to pharmacy today. He is to keep follow up appointment.  Thank you.

## 2018-06-18 ENCOUNTER — Other Ambulatory Visit: Payer: Self-pay

## 2018-06-18 ENCOUNTER — Ambulatory Visit: Payer: Self-pay | Admitting: Ophthalmology

## 2018-06-18 ENCOUNTER — Other Ambulatory Visit: Payer: Self-pay | Admitting: Ophthalmology

## 2018-06-18 DIAGNOSIS — N521 Erectile dysfunction due to diseases classified elsewhere: Secondary | ICD-10-CM

## 2018-06-18 LAB — HM DIABETES EYE EXAM

## 2018-06-18 MED ORDER — TADALAFIL 10 MG PO TABS: 10.0000 mg | ORAL_TABLET | Freq: Every day | ORAL | 3 refills | Status: DC | PRN

## 2018-06-22 ENCOUNTER — Telehealth: Payer: Self-pay

## 2018-06-22 NOTE — Telephone Encounter (Signed)
-----   Message from Kallie Locks, FNP sent at 06/16/2018 10:40 PM EDT ----- Regarding: "Lab Results" Lyla Son,   Labs are stable. Cholesterol level is elevated. Rx sent to pharmacy today. He is to keep follow up appointment.  Thank you.

## 2018-06-22 NOTE — Telephone Encounter (Signed)
Left a vm for patient to callback 

## 2018-06-23 NOTE — Telephone Encounter (Signed)
Patient notified and will keep follow up. 

## 2018-06-23 NOTE — Telephone Encounter (Signed)
-----   Message from Natalie M Stroud, FNP sent at 06/16/2018 10:40 PM EDT ----- Regarding: "Lab Results" Carrie,   Labs are stable. Cholesterol level is elevated. Rx sent to pharmacy today. He is to keep follow up appointment.  Thank you.  

## 2018-07-17 ENCOUNTER — Telehealth: Payer: Self-pay | Admitting: Pharmacy Technician

## 2018-07-17 NOTE — Telephone Encounter (Signed)
Patient has eligibility appointment on July 21, 2018 [facility].  Unable to see patient.  Have left patient two HIPPA compliant messages.  Patient has not returned my phone call.  [name] Care Manager [facility]

## 2018-07-21 ENCOUNTER — Encounter (INDEPENDENT_AMBULATORY_CARE_PROVIDER_SITE_OTHER): Payer: Self-pay

## 2018-07-21 ENCOUNTER — Ambulatory Visit: Payer: Self-pay

## 2018-07-21 ENCOUNTER — Ambulatory Visit: Payer: Self-pay | Admitting: Pharmacy Technician

## 2018-07-21 DIAGNOSIS — Z79899 Other long term (current) drug therapy: Secondary | ICD-10-CM

## 2018-07-21 NOTE — Progress Notes (Signed)
Met with patient completed financial assistance application for [place] due to recent ED visit.  Patient agreed to be responsible for gathering financial information and forwarding to appropriate department in [facility].    Completed [facility] application and contract.  Patient agreed to all terms of the [facility] contract.    Patient approved to receive medication assistance at [facility] as long as eligibility criteria is met.    Provided patient with Civil engineer, contracting based on his particular needs.    C[employer] Prescription Application completed with patient.  Forwarded to [facility] for signature.  Upon receipt of signed application from provider, Cial[employer] Prescription Application will be submitted to [place].  Alexander [name] [facility]

## 2018-08-10 ENCOUNTER — Telehealth: Payer: Self-pay | Admitting: Pharmacist

## 2018-08-10 NOTE — Telephone Encounter (Signed)
08/10/2018 3:33:20 PM - [employer] letter to pt for current poi  08/10/18-I have received notice from [employer] for income-the income I have and previously sent to them is for May - July. I am mailing letter to patient for him to provider us for 1 month of current income.Forde RadonAJ

## 2018-09-10 ENCOUNTER — Ambulatory Visit: Payer: Self-pay | Admitting: Adult Health

## 2018-09-10 ENCOUNTER — Encounter: Payer: Self-pay | Admitting: Adult Health

## 2018-09-10 VITALS — BP 126/88 | HR 69 | Temp 97.8°F | Ht 72.0 in | Wt 259.9 lb

## 2018-09-10 DIAGNOSIS — N521 Erectile dysfunction due to diseases classified elsewhere: Secondary | ICD-10-CM

## 2018-09-10 DIAGNOSIS — F319 Bipolar disorder, unspecified: Secondary | ICD-10-CM

## 2018-09-10 DIAGNOSIS — G4733 Obstructive sleep apnea (adult) (pediatric): Secondary | ICD-10-CM

## 2018-09-10 DIAGNOSIS — E1140 Type 2 diabetes mellitus with diabetic neuropathy, unspecified: Secondary | ICD-10-CM

## 2018-09-10 DIAGNOSIS — E6609 Other obesity due to excess calories: Secondary | ICD-10-CM

## 2018-09-10 DIAGNOSIS — E1169 Type 2 diabetes mellitus with other specified complication: Secondary | ICD-10-CM

## 2018-09-10 DIAGNOSIS — Z6834 Body mass index (BMI) 34.0-34.9, adult: Secondary | ICD-10-CM

## 2018-09-10 DIAGNOSIS — E785 Hyperlipidemia, unspecified: Secondary | ICD-10-CM

## 2018-09-10 NOTE — Progress Notes (Signed)
Patient: Alexander [name] Male    DOB: [birth year]   48 y.o.   MRN: [id number] Visit Date: 09/10/2018  Today's Provider: Deforest Hoyles, NP   Chief Complaint  Patient presents with  . Follow-up    diabetes check, requests education on Atorvastatin   Subjective:    HPI 48 Y/O male who presents for f/u of T2DM, bipolar disorder, ED, obesity and chronic pain. He reports feeling well. His blood glucose is well controlled with all readings less than 166m/dl.  He is on metformin 500 mg twice a day but states that he has been taking it just once a day.  He is due for repeat labs today.  Denies any medication side effects.  He is using is CPAP machine religiously. He reports good sleep quality.  His blood pressures well controlled.  He has not been taking his Lipitor due to the fact that he did not understand why he had to be taking it.  He reports moderate improvement in erectile dysfunction with Cialis.  He denies chest pain palpitations dizziness and headache.    No Known Allergies Previous Medications   ATORVASTATIN (LIPITOR) 10 MG TABLET    Take 1 tablet (10 mg total) by mouth daily.   GLUCOSE BLOOD TEST STRIP    USE ONE STRIP tid to monitor blood glucose ACB, ACD and HS   HYDROCHLOROTHIAZIDE (HYDRODIURIL) 12.5 MG TABLET    Take 1 tablet (12.5 mg total) by mouth daily.   LANCETS MISC    1 Stick by Does not apply route 3 (three) times daily. USE ONE LANCET tid to monitor blood glucose ACB, ACD and HS   METFORMIN (GLUCOPHAGE) 500 MG TABLET    Take 1 tablet (500 mg total) by mouth 2 (two) times daily with a meal.   TADALAFIL (CIALIS) 10 MG TABLET    Take 1 tablet (10 mg total) by mouth daily as needed for erectile dysfunction.    Review of Systems  Constitutional: Negative.   Respiratory: Negative.   Cardiovascular: Negative.   Gastrointestinal: Negative.   Endocrine: Negative.   Genitourinary:       Improvement in erectile dysfunction  Musculoskeletal: Negative.   Skin: Negative.    Neurological: Negative.     Social History   Tobacco Use  . Smoking status: Current Every Day Smoker    Packs/day: 0.10    Years: 3.00    Pack years: 0.30    Types: Cigarettes    Last attempt to quit: 09/02/1996    Years since quitting: 22.0  . Smokeless tobacco: Never Used  . Tobacco comment: only smoked socially through college.  Substance Use Topics  . Alcohol use: Yes    Comment: 2 beers a week   Objective:   BP 126/88 (BP Location: Left Arm, Patient Position: Sitting)   Pulse 69   Temp 97.8 F (36.6 C) (Oral)   Ht 6' (1.829 m)   Wt 259 lb 14.4 oz (117.9 kg)   BMI 35.25 kg/m   Physical Exam Vitals signs and nursing note reviewed.  Constitutional:      Appearance: Normal appearance. He is obese.  Eyes:     Extraocular Movements: Extraocular movements intact.     Pupils: Pupils are equal, round, and reactive to light.  Neck:     Musculoskeletal: Normal range of motion and neck supple.  Cardiovascular:     Rate and Rhythm: Normal rate and regular rhythm.     Pulses: Normal pulses.     Heart sounds:  Normal heart sounds.  Pulmonary:     Effort: Pulmonary effort is normal.     Breath sounds: Normal breath sounds.  Abdominal:     General: Bowel sounds are normal.     Palpations: Abdomen is soft.  Musculoskeletal: Normal range of motion.  Skin:    Capillary Refill: Capillary refill takes less than 2 seconds.  Neurological:     General: No focal deficit present.     Mental Status: He is alert and oriented to person, place, and time.  Psychiatric:        Mood and Affect: Mood normal.    Assessment & Plan:  1. Type 2 diabetes mellitus with diabetic neuropathy, without long-term current use of insulin (HCC) Continue metformin 500 mg daily.  Will obtain repeat labs - CBC w/Diff - Comp Met (CMET) - Lipid Profile - HgB A1c - Magnesium - Phosphorus  2. Class 1 obesity due to excess calories with serious comorbidity and body mass index (BMI) of 34.0 to 34.9 in  adult Continue weight loss efforts. Dietary recommendations reviewed with patient   3. Obstructive sleep apnea on CPAP Continue CPAP at bedtime  4. Bipolar 1 disorder ([place]) Continue follow-up with psychiatrist  5. Erectile disorder due to medical condition in male Continue Cialis and follow-up with urology once charity care application is approved - Ambulatory referral to Urology  6.  Hyperlipidemia associated with type 2 diabetes Lipitor 10 mg nightly.  Will repeat lipid panel today Deforest Hoyles, NP   [facility]

## 2018-09-10 NOTE — Progress Notes (Signed)
+  [phone] 

## 2018-09-11 LAB — COMPREHENSIVE METABOLIC PANEL
A/G RATIO: 2 (ref 1.2–2.2)
ALK PHOS: 79 IU/L (ref 39–117)
ALT: 30 IU/L (ref 0–44)
AST: 18 IU/L (ref 0–40)
Albumin: 4.7 g/dL (ref 3.5–5.5)
BILIRUBIN TOTAL: 0.3 mg/dL (ref 0.0–1.2)
BUN/Creatinine Ratio: 23 — ABNORMAL HIGH (ref 9–20)
BUN: 20 mg/dL (ref 6–24)
CHLORIDE: 102 mmol/L (ref 96–106)
CO2: 22 mmol/L (ref 20–29)
Calcium: 9.2 mg/dL (ref 8.7–10.2)
Creatinine, Ser: 0.88 mg/dL (ref 0.76–1.27)
GFR calc non Af Amer: 104 mL/min/{1.73_m2} (ref >59)
GFR, EST AFRICAN AMERICAN: 120 mL/min/{1.73_m2} (ref >59)
GLUCOSE: 82 mg/dL (ref 65–99)
Globulin, Total: 2.3 g/dL (ref 1.5–4.5)
POTASSIUM: 4.9 mmol/L (ref 3.5–5.2)
Sodium: 140 mmol/L (ref 134–144)
TOTAL PROTEIN: 7 g/dL (ref 6.0–8.5)

## 2018-09-11 LAB — HEMOGLOBIN A1C
Est. average glucose Bld gHb Est-mCnc: 131 mg/dL
Hgb A1c MFr Bld: 6.2 % — ABNORMAL HIGH (ref 4.8–5.6)

## 2018-09-11 LAB — CBC WITH DIFFERENTIAL/PLATELET
BASOS ABS: 0.1 10*3/uL (ref 0.0–0.2)
Basos: 1 %
EOS (ABSOLUTE): 0.4 10*3/uL (ref 0.0–0.4)
Eos: 5 %
Hematocrit: 49.8 % (ref 37.5–51.0)
Hemoglobin: 16.9 g/dL (ref 13.0–17.7)
IMMATURE GRANS (ABS): 0 10*3/uL (ref 0.0–0.1)
Immature Granulocytes: 0 %
LYMPHS: 39 %
Lymphocytes Absolute: 3.3 10*3/uL — ABNORMAL HIGH (ref 0.7–3.1)
MCH: 29.7 pg (ref 26.6–33.0)
MCHC: 33.9 g/dL (ref 31.5–35.7)
MCV: 88 fL (ref 79–97)
MONOS ABS: 0.5 10*3/uL (ref 0.1–0.9)
Monocytes: 6 %
NEUTROS PCT: 49 %
Neutrophils Absolute: 4.2 10*3/uL (ref 1.4–7.0)
PLATELETS: 262 10*3/uL (ref 150–450)
RBC: 5.69 x10E6/uL (ref 4.14–5.80)
RDW: 13 % (ref 11.6–15.4)
WBC: 8.5 10*3/uL (ref 3.4–10.8)

## 2018-09-11 LAB — LIPID PANEL
CHOLESTEROL TOTAL: 178 mg/dL (ref 100–199)
Chol/HDL Ratio: 5.4 ratio — ABNORMAL HIGH (ref 0.0–5.0)
HDL: 33 mg/dL — AB (ref >39)
LDL Calculated: 118 mg/dL — ABNORMAL HIGH (ref 0–99)
TRIGLYCERIDES: 137 mg/dL (ref 0–149)
VLDL Cholesterol Cal: 27 mg/dL (ref 5–40)

## 2018-09-11 LAB — PHOSPHORUS: Phosphorus: 3.5 mg/dL (ref 2.5–4.5)

## 2018-09-11 LAB — MAGNESIUM: Magnesium: 2.2 mg/dL (ref 1.6–2.3)

## 2018-09-12 ENCOUNTER — Encounter: Payer: Self-pay | Admitting: Adult Health

## 2018-09-12 DIAGNOSIS — E1140 Type 2 diabetes mellitus with diabetic neuropathy, unspecified: Secondary | ICD-10-CM | POA: Insufficient documentation

## 2018-09-12 MED ORDER — METFORMIN HCL 500 MG PO TABS: 500.0000 mg | ORAL_TABLET | Freq: Every day | ORAL | 2 refills | Status: DC

## 2018-10-14 ENCOUNTER — Encounter: Payer: Self-pay | Admitting: Urology

## 2018-10-14 DIAGNOSIS — E1140 Type 2 diabetes mellitus with diabetic neuropathy, unspecified: Secondary | ICD-10-CM

## 2018-10-15 LAB — TESTOSTERONE: TESTOSTERONE: 183 ng/dL — AB (ref 264–916)

## 2018-12-10 ENCOUNTER — Ambulatory Visit: Payer: Self-pay | Admitting: Adult Health

## 2018-12-16 NOTE — Progress Notes (Signed)
Established Patient Office Visit  Subjective:  Patient ID: Alexander [name], male    DOB: 19-Aug-1971  Age: 48 y.o. MRN: [id number]  CC:  Chief Complaint  Patient presents with  . Follow-up    Diabetes    HPI Elena Cothern presents for follow up for T2 DM, Obesity, Hyperlipidemia, Sleep apnea and erectile dysfunction.  Patient consents to telephone visit and 2 patient identifier was used to identify patient.   His last HgbA1c was 6.2% done on 09/10/18, he takes 500 mg metformin daily and reports that his average fasting blood glucose for the past 30 days was 106 mg/dl. He denies hypoglycemia/ hyperglycemic symptoms. He states that he's compliant with ADA diet, performs daily foot checks and very active at work.  His Total cholesterol 178 mg/dl, HDL 33 mg/dl, LDL 118 mg/dl, Triglycerides 137 mg/dl done on 09/10/18. He reports that he sometimes forgets to take 10 mg Atorvastatin but he takes it at least 5 days a week. He denies myalgia and continues to work on lifestyle modifications.  He states that he has not lost any weight recently but continues to be active at work. He continues to use CPAP machine and reports good quality of sleep. His Testosterone was 183 ng/dl and he continues to take Cialis 10 mg as needed and will follow up with the Urologist. He denies fever, chills,chest pain, palpitation, shortness of breath and no further concerns.  Past Medical History:  Diagnosis Date  . Bipolar 1 disorder ([place])   . Herniated cervical disc    C6  . Neck pain   . Obesity   . Pollen allergies   . Type 2 diabetes mellitus ([place])     Past Surgical History:  Procedure Laterality Date  . VASECTOMY      Family History  Problem Relation Age of Onset  . Colon cancer Mother 14  . Breast cancer Mother 51    Social History   Socioeconomic History  . Marital status: Married    Spouse name: Not on file  . Number of children: 0  . Years of education: Not on file  . Highest education level:  Not on file  Occupational History  . Occupation: MUSICIAN  Social Needs  . Financial resource strain: Not on file  . Food insecurity:    Worry: Not on file    Inability: Not on file  . Transportation needs:    Medical: Not on file    Non-medical: Not on file  Tobacco Use  . Smoking status: Current Every Day Smoker    Packs/day: 0.10    Years: 3.00    Pack years: 0.30    Types: Cigarettes    Last attempt to quit: 09/02/1996    Years since quitting: 22.3  . Smokeless tobacco: Never Used  . Tobacco comment: only smoked socially through college.  Substance and Sexual Activity  . Alcohol use: Yes    Comment: 2 beers a week  . Drug use: No    Comment: OCCASIONAL MJ  . Sexual activity: Not on file  Lifestyle  . Physical activity:    Days per week: Not on file    Minutes per session: Not on file  . Stress: Not on file  Relationships  . Social connections:    Talks on phone: Not on file    Gets together: Not on file    Attends religious service: Not on file    Active member of club or organization: Not on file  Attends meetings of clubs or organizations: Not on file    Relationship status: Not on file  . Intimate partner violence:    Fear of current or ex partner: Not on file    Emotionally abused: Not on file    Physically abused: Not on file    Forced sexual activity: Not on file  Other Topics Concern  . Not on file  Social History Narrative  . Not on file    Outpatient Medications Prior to Visit  Medication Sig Dispense Refill  . atorvastatin (LIPITOR) 10 MG tablet Take 1 tablet (10 mg total) by mouth daily. 30 tablet 3  . glucose blood test strip USE ONE STRIP tid to monitor blood glucose ACB, ACD and HS 100 each 12  . Lancets MISC 1 Stick by Does not apply route 3 (three) times daily. USE ONE LANCET tid to monitor blood glucose ACB, ACD and HS 90 each 3  . metFORMIN (GLUCOPHAGE) 500 MG tablet Take 1 tablet (500 mg total) by mouth daily with breakfast. 90 tablet 2   . tadalafil (CIALIS) 10 MG tablet Take 1 tablet (10 mg total) by mouth daily as needed for erectile dysfunction. 10 tablet 3  . hydrochlorothiazide (HYDRODIURIL) 12.5 MG tablet Take 1 tablet (12.5 mg total) by mouth daily. (Patient not taking: Reported on 06/04/2018) 30 tablet 1   No facility-administered medications prior to visit.     No Known Allergies  ROS Review of Systems  Constitutional: Negative.   HENT: Negative.   Respiratory: Negative.   Cardiovascular: Negative.   Gastrointestinal: Negative.   Endocrine: Negative.   Genitourinary: Negative.   Musculoskeletal: Negative.   Skin: Negative.   Neurological: Negative.   Psychiatric/Behavioral: Negative.       Objective:    Physical Exam   No vital sign and PE done.  There were no vitals taken for this visit. Wt Readings from Last 3 Encounters:  09/10/18 259 lb 14.4 oz (117.9 kg)  06/04/18 257 lb 14.4 oz (117 kg)  04/10/18 267 lb (121.1 kg)     Health Maintenance Due  Topic Date Due  . PNEUMOCOCCAL POLYSACCHARIDE VACCINE AGE 31-64 HIGH RISK  07/21/1975  . FOOT EXAM  07/21/1983  . HIV Screening  07/20/1988  . TETANUS/TDAP  07/20/1992    There are no preventive care reminders to display for this patient.  Lab Results  Component Value Date   TSH 1.610 06/04/2018   Lab Results  Component Value Date   WBC 8.5 09/10/2018   HGB 16.9 09/10/2018   HCT 49.8 09/10/2018   MCV 88 09/10/2018   PLT 262 09/10/2018   Lab Results  Component Value Date   NA 140 09/10/2018   K 4.9 09/10/2018   CO2 22 09/10/2018   GLUCOSE 82 09/10/2018   BUN 20 09/10/2018   CREATININE 0.88 09/10/2018   BILITOT 0.3 09/10/2018   ALKPHOS 79 09/10/2018   AST 18 09/10/2018   ALT 30 09/10/2018   PROT 7.0 09/10/2018   ALBUMIN 4.7 09/10/2018   CALCIUM 9.2 09/10/2018   ANIONGAP 12 04/10/2018   GFR 100.91 12/28/2012   Lab Results  Component Value Date   CHOL 178 09/10/2018   Lab Results  Component Value Date   HDL 33 (L)  09/10/2018   Lab Results  Component Value Date   LDLCALC 118 (H) 09/10/2018   He was advised to continue on low fat low cholesterol diet and continue on weight loss regimen.  Lab Results  Component Value Date  TRIG 137 09/10/2018   Lab Results  Component Value Date   CHOLHDL 5.4 (H) 09/10/2018   Lab Results  Component Value Date   HGBA1C 6.2 (H) 09/10/2018      Assessment & Plan:     1. Type 2 diabetes mellitus with diabetic neuropathy, without long-term current use of insulin (HCC) - Diabetes is controlled and he will continue on current medication . - Use Diabetic diet as advised  . Check fasting blood sugar and record . Write down the numbers against date in a log . Bring log to clinic every visit . Take medications regularly as advised . Regular exercise -HgB A1c; Future - Comp Met (CMET); Future   2. Hyperlipidemia associated with type 2 diabetes mellitus ([place]) - He was advised to continue on current treatment regimen. . -Low fat Diet, like low fat dairy products eg skimmed milk . Avoid any fried food . Regular exercise/walk . Goal for Total Cholesterol is less than 200 . Goal for bad cholesterol LDL is less than 70 . Goal for Good cholesterol HDL is more than 45 . Goal for Triglyceride is less than 150   Lipid panel; Future  3. Class 1 obesity due to excess calories with serious comorbidity and body mass index (BMI) of 34.0 to 34.9 in adult - Counseled the patient on lifestyle modifications including diet and exercise. - Counseled on the importance of exercise, starting an exercise program of walking 30 minutes 3-5 times a week. - Counseled on low fat, low calorie, portion control diet. - 4. Obstructive sleep apnea on CPAP - He will continue to use CPAP at night  5. Erectile disorder due to medical condition in male - He will continue current treatment regimen and will follow up with Urologist.     Follow-up: Return in about 6 weeks (around  01/28/2019), or if symptoms worsen or fail to improve.    Alastair Hennes Jerold Coombe, NP

## 2018-12-17 ENCOUNTER — Ambulatory Visit: Payer: Self-pay | Admitting: Adult Health

## 2018-12-17 ENCOUNTER — Other Ambulatory Visit: Payer: Self-pay

## 2018-12-17 ENCOUNTER — Ambulatory Visit: Payer: Self-pay | Admitting: Gerontology

## 2018-12-17 ENCOUNTER — Encounter: Payer: Self-pay | Admitting: Gerontology

## 2018-12-17 DIAGNOSIS — G4733 Obstructive sleep apnea (adult) (pediatric): Secondary | ICD-10-CM

## 2018-12-17 DIAGNOSIS — N521 Erectile dysfunction due to diseases classified elsewhere: Secondary | ICD-10-CM

## 2018-12-17 DIAGNOSIS — E6609 Other obesity due to excess calories: Secondary | ICD-10-CM

## 2018-12-17 DIAGNOSIS — E785 Hyperlipidemia, unspecified: Principal | ICD-10-CM

## 2018-12-17 DIAGNOSIS — E1169 Type 2 diabetes mellitus with other specified complication: Secondary | ICD-10-CM

## 2018-12-17 DIAGNOSIS — Z6834 Body mass index (BMI) 34.0-34.9, adult: Secondary | ICD-10-CM

## 2018-12-17 DIAGNOSIS — E1140 Type 2 diabetes mellitus with diabetic neuropathy, unspecified: Secondary | ICD-10-CM

## 2018-12-23 ENCOUNTER — Telehealth: Payer: Self-pay | Admitting: Pharmacist

## 2018-12-23 NOTE — Telephone Encounter (Signed)
12/23/2018 8:37:54 AM - Cialis-- 2nd request letter to patient  12/23/2018 I received a letter dated 08/05/2018 for income-I had submitted the income I was provided for May & June 2019. I previously mailed a letter to patient requesting 1 month of more current income, patient has not sent that information back to [place]. I am today sending a 2nd request letter to patient for this information and copy of 2019 taxes or sign [id number]. Without this information Alexander [name] will not process for Cialis.[name]

## 2019-01-13 ENCOUNTER — Encounter: Payer: Self-pay | Admitting: Urology

## 2019-01-21 ENCOUNTER — Other Ambulatory Visit: Payer: Self-pay

## 2019-01-21 DIAGNOSIS — E1169 Type 2 diabetes mellitus with other specified complication: Secondary | ICD-10-CM

## 2019-01-21 DIAGNOSIS — E785 Hyperlipidemia, unspecified: Secondary | ICD-10-CM

## 2019-01-21 DIAGNOSIS — E1140 Type 2 diabetes mellitus with diabetic neuropathy, unspecified: Secondary | ICD-10-CM

## 2019-01-22 LAB — COMPREHENSIVE METABOLIC PANEL
ALT: 25 IU/L (ref 0–44)
AST: 18 IU/L (ref 0–40)
Albumin/Globulin Ratio: 2.4 — ABNORMAL HIGH (ref 1.2–2.2)
Albumin: 4.7 g/dL (ref 4.0–5.0)
Alkaline Phosphatase: 76 IU/L (ref 39–117)
BUN/Creatinine Ratio: 16 (ref 9–20)
BUN: 14 mg/dL (ref 6–24)
Bilirubin Total: <0.2 mg/dL (ref 0.0–1.2)
CO2: 22 mmol/L (ref 20–29)
Calcium: 9.2 mg/dL (ref 8.7–10.2)
Chloride: 102 mmol/L (ref 96–106)
Creatinine, Ser: 0.9 mg/dL (ref 0.76–1.27)
GFR calc Af Amer: 119 mL/min/{1.73_m2} (ref >59)
GFR calc non Af Amer: 103 mL/min/{1.73_m2} (ref >59)
Globulin, Total: 2 g/dL (ref 1.5–4.5)
Glucose: 102 mg/dL — ABNORMAL HIGH (ref 65–99)
Potassium: 4.4 mmol/L (ref 3.5–5.2)
Sodium: 140 mmol/L (ref 134–144)
Total Protein: 6.7 g/dL (ref 6.0–8.5)

## 2019-01-22 LAB — LIPID PANEL
Chol/HDL Ratio: 5.2 ratio — ABNORMAL HIGH (ref 0.0–5.0)
Cholesterol, Total: 171 mg/dL (ref 100–199)
HDL: 33 mg/dL — ABNORMAL LOW (ref >39)
LDL Calculated: 88 mg/dL (ref 0–99)
Triglycerides: 249 mg/dL — ABNORMAL HIGH (ref 0–149)
VLDL Cholesterol Cal: 50 mg/dL — ABNORMAL HIGH (ref 5–40)

## 2019-01-22 LAB — HEMOGLOBIN A1C
Est. average glucose Bld gHb Est-mCnc: 128 mg/dL
Hgb A1c MFr Bld: 6.1 % — ABNORMAL HIGH (ref 4.8–5.6)

## 2019-01-28 ENCOUNTER — Ambulatory Visit: Payer: Self-pay | Admitting: Adult Health

## 2019-02-04 ENCOUNTER — Other Ambulatory Visit: Payer: Self-pay

## 2019-02-04 ENCOUNTER — Ambulatory Visit: Payer: Self-pay | Admitting: Adult Health

## 2019-02-04 ENCOUNTER — Ambulatory Visit: Payer: Self-pay | Admitting: Gerontology

## 2019-02-04 DIAGNOSIS — E1169 Type 2 diabetes mellitus with other specified complication: Secondary | ICD-10-CM

## 2019-02-04 DIAGNOSIS — N521 Erectile dysfunction due to diseases classified elsewhere: Secondary | ICD-10-CM

## 2019-02-04 DIAGNOSIS — G4733 Obstructive sleep apnea (adult) (pediatric): Secondary | ICD-10-CM

## 2019-02-04 DIAGNOSIS — E785 Hyperlipidemia, unspecified: Secondary | ICD-10-CM

## 2019-02-04 DIAGNOSIS — R7303 Prediabetes: Secondary | ICD-10-CM

## 2019-02-04 NOTE — Progress Notes (Signed)
Established Patient Office Visit  Subjective:  Patient ID: Alexander [name], male    DOB: 05-11-1971  Age: 47 y.o. MRN: [id number]  CC:  Chief Complaint  Patient presents with  . Follow-up    diabetes   Patient consents to telephone visit and two patient identifiers was used to identify patient. HPI Alexander [name] presents for follow up for T2 DM, Hyperlipidemia, erectile dysfunction, Sleep apnea and lab review.His last HgbA1c was 6.1% done on 01/21/19, he takes 500 mg metformin daily and reports that his average fasting blood glucose for the past 30 days was in the low 100's. He denies hypoglycemia/ hyperglycemic symptoms. He states that he's compliant with ADA diet, performs daily foot checks and walks 30 miles a week at work. His Total cholesterol was 171 mg/dl, HDL 33 mg/dl, LDL 88  mg/dl, Triglycerides 249 mg/dl done on 01/21/19. He reports that he sometimes forgets to take 10 mg Atorvastatin but he takes it 75% of  the time. He denies myalgia and continues to work on lifestyle modifications.  He states that he is exercising more and working on his weight loss regimen. He continues to use CPAP machine and reports good quality of sleep. His Testosterone was 183 ng/dl and he continues to take Cialis 10 mg as needed and will follow up with  Urologist. He denies fever, chills,chest pain, palpitation, shortness of breath and no further concerns.  Past Medical History:  Diagnosis Date  . Bipolar 1 disorder ([place])   . Herniated cervical disc    C6  . Neck pain   . Obesity   . Pollen allergies   . Type 2 diabetes mellitus ([place])     Past Surgical History:  Procedure Laterality Date  . VASECTOMY      Family History  Problem Relation Age of Onset  . Colon cancer Mother 41  . Breast cancer Mother 42    Social History   Socioeconomic History  . Marital status: Married    Spouse name: Not on file  . Number of children: 0  . Years of education: Not on file  . Highest education level:  Not on file  Occupational History  . Occupation: MUSICIAN  Social Needs  . Financial resource strain: Not on file  . Food insecurity:    Worry: Not on file    Inability: Not on file  . Transportation needs:    Medical: Not on file    Non-medical: Not on file  Tobacco Use  . Smoking status: Current Every Day Smoker    Packs/day: 0.10    Years: 3.00    Pack years: 0.30    Types: Cigarettes    Last attempt to quit: 09/02/1996    Years since quitting: 22.4  . Smokeless tobacco: Never Used  . Tobacco comment: only smoked socially through college.  Substance and Sexual Activity  . Alcohol use: Yes    Comment: 2 beers a week  . Drug use: No    Comment: OCCASIONAL MJ  . Sexual activity: Not on file  Lifestyle  . Physical activity:    Days per week: Not on file    Minutes per session: Not on file  . Stress: Not on file  Relationships  . Social connections:    Talks on phone: Not on file    Gets together: Not on file    Attends religious service: Not on file    Active member of club or organization: Not on file    Attends meetings  of clubs or organizations: Not on file    Relationship status: Not on file  . Intimate partner violence:    Fear of current or ex partner: Not on file    Emotionally abused: Not on file    Physically abused: Not on file    Forced sexual activity: Not on file  Other Topics Concern  . Not on file  Social History Narrative  . Not on file    Outpatient Medications Prior to Visit  Medication Sig Dispense Refill  . atorvastatin (LIPITOR) 10 MG tablet Take 1 tablet (10 mg total) by mouth daily. 30 tablet 3  . glucose blood test strip USE ONE STRIP tid to monitor blood glucose ACB, ACD and HS 100 each 12  . Lancets MISC 1 Stick by Does not apply route 3 (three) times daily. USE ONE LANCET tid to monitor blood glucose ACB, ACD and HS 90 each 3  . metFORMIN (GLUCOPHAGE) 500 MG tablet Take 1 tablet (500 mg total) by mouth daily with breakfast. 90 tablet 2   . tadalafil (CIALIS) 10 MG tablet Take 1 tablet (10 mg total) by mouth daily as needed for erectile dysfunction. 10 tablet 3   No facility-administered medications prior to visit.     No Known Allergies  ROS Review of Systems  Constitutional: Negative.   HENT: Negative.   Respiratory: Negative.   Cardiovascular: Negative.   Gastrointestinal: Negative.   Genitourinary: Negative.   Musculoskeletal: Negative.   Skin: Negative.   Neurological: Negative.   Psychiatric/Behavioral: Negative.       Objective:    Physical Exam No vital sign or PE done There were no vitals taken for this visit. Wt Readings from Last 3 Encounters:  09/10/18 259 lb 14.4 oz (117.9 kg)  06/04/18 257 lb 14.4 oz (117 kg)  04/10/18 267 lb (121.1 kg)     Health Maintenance Due  Topic Date Due  . PNEUMOCOCCAL POLYSACCHARIDE VACCINE AGE 68-64 HIGH RISK  07/21/1975  . FOOT EXAM  07/21/1983  . HIV Screening  07/20/1988  . TETANUS/TDAP  07/20/1992    There are no preventive care reminders to display for this patient.  Lab Results  Component Value Date   TSH 1.610 06/04/2018   Lab Results  Component Value Date   WBC 8.5 09/10/2018   HGB 16.9 09/10/2018   HCT 49.8 09/10/2018   MCV 88 09/10/2018   PLT 262 09/10/2018   Lab Results  Component Value Date   NA 140 01/21/2019   K 4.4 01/21/2019   CO2 22 01/21/2019   GLUCOSE 102 (H) 01/21/2019   BUN 14 01/21/2019   CREATININE 0.90 01/21/2019   BILITOT <0.2 01/21/2019   ALKPHOS 76 01/21/2019   AST 18 01/21/2019   ALT 25 01/21/2019   PROT 6.7 01/21/2019   ALBUMIN 4.7 01/21/2019   CALCIUM 9.2 01/21/2019   ANIONGAP 12 04/10/2018   GFR 100.91 12/28/2012   Lab Results  Component Value Date   CHOL 171 01/21/2019   Lab Results  Component Value Date   HDL 33 (L) 01/21/2019   Lab Results  Component Value Date   LDLCALC 88 01/21/2019   Lab Results  Component Value Date   TRIG 249 (H) 01/21/2019   Lab Results  Component Value Date    CHOLHDL 5.2 (H) 01/21/2019   Lab Results  Component Value Date   HGBA1C 6.1 (H) 01/21/2019      Assessment & Plan:    1. Pre-diabetes - He will continue on  current treatment regimen and his HgbA1c of 6.1% is at goal. -Use Diabetic diet as advised  - Continue to check and record blood glucose daily.Check blood sugar  -Take medications regularly as advised -Regular exercise - CBC w/Diff; Future - Comp Met (CMET); Future - HgB A1c; Future  2. Hyperlipidemia associated with type 2 diabetes mellitus ([place]) - He will continue on current treatment regimen. -Low fat Diet, like low fat dairy products eg skimmed milk -Avoid any fried food -Regular exercise/walk -Goal for Total Cholesterol is less than 200 -Goal for bad cholesterol LDL is less than 100 -Goal for Good cholesterol HDL is more than 45 -Goal for Triglyceride is less than 150 - Lipid panel; Future  3. Obstructive sleep apnea syndrome - He will continue to use CPAP at night.  4. Erectile disorder due to medical condition in male -He will continue current treatment regimen and will follow up with Urologist. - Testosterone; Future    Follow-up: Return in about 3 months (around 05/07/2019), or if symptoms worsen or fail to improve.    Lamar Meter Jerold Coombe, NP

## 2019-03-24 ENCOUNTER — Telehealth: Payer: Self-pay | Admitting: Pharmacy Technician

## 2019-03-24 NOTE — Telephone Encounter (Signed)
Received 2020 proof of income.  Patient eligible to receive medication assistance at [facility] as long as eligibility requirements continue to be met.  [facility]

## 2019-04-29 ENCOUNTER — Other Ambulatory Visit: Payer: Self-pay | Admitting: Gerontology

## 2019-04-29 DIAGNOSIS — E782 Mixed hyperlipidemia: Secondary | ICD-10-CM

## 2019-05-13 ENCOUNTER — Encounter: Payer: Self-pay | Admitting: Adult Health

## 2019-05-13 ENCOUNTER — Ambulatory Visit: Payer: Self-pay | Admitting: Adult Health

## 2019-05-13 ENCOUNTER — Other Ambulatory Visit: Payer: Self-pay

## 2019-05-13 VITALS — Ht 72.0 in | Wt 270.0 lb

## 2019-05-13 DIAGNOSIS — E785 Hyperlipidemia, unspecified: Secondary | ICD-10-CM

## 2019-05-13 DIAGNOSIS — G4733 Obstructive sleep apnea (adult) (pediatric): Secondary | ICD-10-CM

## 2019-05-13 DIAGNOSIS — E1169 Type 2 diabetes mellitus with other specified complication: Secondary | ICD-10-CM

## 2019-05-13 DIAGNOSIS — N521 Erectile dysfunction due to diseases classified elsewhere: Secondary | ICD-10-CM

## 2019-05-13 DIAGNOSIS — E782 Mixed hyperlipidemia: Secondary | ICD-10-CM

## 2019-05-13 MED ORDER — ATORVASTATIN CALCIUM 10 MG PO TABS: 10.0000 mg | ORAL_TABLET | Freq: Every day | ORAL | 0 refills | Status: DC

## 2019-05-13 MED ORDER — TADALAFIL 10 MG PO TABS: 10.0000 mg | ORAL_TABLET | Freq: Every day | ORAL | 3 refills | Status: DC | PRN

## 2019-05-13 MED ORDER — METFORMIN HCL 500 MG PO TABS: 500.0000 mg | ORAL_TABLET | Freq: Every day | ORAL | 2 refills | Status: DC

## 2019-05-13 MED ORDER — LANCETS MISC: 1.0000 | Freq: Three times a day (TID) | 3 refills | Status: AC

## 2019-05-13 MED ORDER — GLUCOSE BLOOD VI STRP: ORAL_STRIP | 12 refills | Status: AC

## 2019-05-13 NOTE — Progress Notes (Signed)
Patient: Alexander [name] Male    DOB: 1971-06-08   48 y.o.   MRN: [id number] Visit Date: 05/13/2019 Patient consents to Telephone/or Telehealth visit and two identifiers have been used to establish patient's identity prior to visit Today's Provider: Deforest Hoyles, NP   Chief Complaint  Patient presents with  . Diabetes   Subjective:    HPI This is a 48 y/o male seen via telephonic visit for evaluation type 2 DM, hyperlipidemia, OSA and ED. He offers no specific complaints. His blood glucose levels 107-182m/dl. He is on metformin 5074mdaily. He denies any medication side effects. He does not monitor his blood glucose levels consistently. He denies any hypoglycemic episodes. He reports following a low carb/low fat diet and exercising daily. States that he ran out of his lipitor about 2 weeks ago. His last weight was 270Lbs. He does not monitor his blood pressures at home. His last testosterone level was low and he would like a urology/or endocrinology referral. He is on Cialis and reports moderate ED control. His last labs were in may 2020. He is due for repeat labs     No Known Allergies Previous Medications   ATORVASTATIN (LIPITOR) 10 MG TABLET    TAKE ONE TABLET BY MOUTH EVERY DAY   GLUCOSE BLOOD TEST STRIP    USE ONE STRIP tid to monitor blood glucose ACB, ACD and HS   LANCETS MISC    1 Stick by Does not apply route 3 (three) times daily. USE ONE LANCET tid to monitor blood glucose ACB, ACD and HS   METFORMIN (GLUCOPHAGE) 500 MG TABLET    Take 1 tablet (500 mg total) by mouth daily with breakfast.   TADALAFIL (CIALIS) 10 MG TABLET    Take 1 tablet (10 mg total) by mouth daily as needed for erectile dysfunction.    Review of Systems  Constitutional: Negative.   Eyes: Negative.   Respiratory: Negative.   Cardiovascular: Negative.   Gastrointestinal: Negative.   Endocrine: Negative.   Musculoskeletal: Negative.   Skin: Negative.   Neurological: Negative.     Social  History   Tobacco Use  . Smoking status: Current Every Day Smoker    Packs/day: 0.10    Years: 3.00    Pack years: 0.30    Types: Cigarettes    Last attempt to quit: 09/02/1996    Years since quitting: 22.7  . Smokeless tobacco: Never Used  . Tobacco comment: only smoked socially through college.  Substance Use Topics  . Alcohol use: Yes    Comment: 2 beers a week   Objective:   Ht 6' (1.829 m)   Wt 270 lb (122.5 kg) Comment: pt stated  BMI 36.62 kg/m   Physical Exam  Unable to obtain as this is a virtual visit    Assessment & Plan:  1. Hyperlipidemia associated with type 2 diabetes mellitus ([place] was abnormal and patient is not very compliant with his lipid medications. Will repeat lipid panel. Encouraged to adhere to a low fat diet and take statin as prescribed. - Comp Met (CMET); Future - Lipid Profile; Future - HgB A1c; Future - Testosterone; Future  2. Type 2 diabetes mellitus with other specified complication, without long-term current use of insulin (HCC) Well controlled with metformin. Continue current dose - Comp Met (CMET); Future - Lipid Profile; Future - HgB A1c; Future - Testosterone; Future - Ambulatory referral to Endocrinology  3. Morbid obesity ([place] advice given. Challenges reviewed and addressed -  Lipid Profile; Future - HgB A1c; Future - Testosterone; Future  4. Erectile disorder due to medical condition in male Moderately controlled. Continue cialis - Ambulatory referral to Urology - Ambulatory referral to Endocrinology  5. Obstructive sleep apnea on CPAP Continue CPAP. No need for pulmonary referral at this time. Risks associated with non-adherence reviewed with patient  Deforest Hoyles, NP   [facility]

## 2019-05-13 NOTE — Addendum Note (Signed)
Addended by: Erlene Quan on: 05/13/2019 01:13 PM   Modules accepted: Orders

## 2019-05-24 ENCOUNTER — Other Ambulatory Visit: Payer: Self-pay | Admitting: Gerontology

## 2019-06-17 ENCOUNTER — Encounter: Payer: Self-pay | Admitting: Urology

## 2019-06-17 ENCOUNTER — Ambulatory Visit: Payer: Self-pay | Admitting: Urology

## 2019-06-22 ENCOUNTER — Ambulatory Visit: Payer: Self-pay | Admitting: Endocrinology

## 2019-06-22 ENCOUNTER — Other Ambulatory Visit: Payer: Self-pay

## 2019-06-22 DIAGNOSIS — E1169 Type 2 diabetes mellitus with other specified complication: Secondary | ICD-10-CM

## 2019-06-22 DIAGNOSIS — N521 Erectile dysfunction due to diseases classified elsewhere: Secondary | ICD-10-CM

## 2019-06-22 NOTE — Progress Notes (Signed)
New Diabetes Consultation [facility]     Patient ID: Alexander [name], male   DOB: 01-09-1971, 48 y.o.   MRN: [id number] Assessment:  Alexander [name] is a 48 y.o. male who is seen in consultation for No primary diagnosis found. at the request of Copland, Frederico Hamman, MD.   Plan:  1. Erectile dysfunction Patient has seen urologist, who said that he had low testosterone levels. Currently prescribed 10mg  Cialis daily, but taking half a pill every three days since he does not have medical insurance and has reached the maximum dose provided by [facility] clinic. Patient reports having sexual drive (being in a relationship that has been more sexual than past ones), but having difficult with the erection and lasting during encounters. States that ED problems have persisted when taking antidepressants for three months in the past. He has since stopped the antidepressants. Testosterone of 183 at 11:11am on 2/212/2020. - 8AM testosterone, free testosterone levels - Prolactin level - f/u with Endo clinic in 1 month  2. Diabetes mellitus Patient has had diabetes diagnosis for approx. 1 year. Currently takes metformin 500 mg 1x day with occasional GI distress. Diet consists of non-sugary drinks (diet cokes, water, coffee with little creamer). Exercise consists of 34-35 miles a week through job and activity. Most recent HbA1c (01/21/2019) of 6.1. Diabetes seems well controlled with medication and lifestyle changes. Patient not exhibiting any signs or symptoms of uncontrolled diabetes. Meformin tolerated with little side effects. Patient recently saw Ophtho for DR screening in past 6 months. - Continue metformin 500 mg qd - Continue with lifestyle modifications - Repeat HbA1c  - Repeat microalbumin and microalb/Cr ratio - DR screening due approximate 12/2019 - Consider foot exam at next in-person clinical visit  Subjective:  CC: diabetes and low testosterone f/u HPI: Alexander [name] is a 48 yo man with PMHx  of erectile disorder with low testosterone and diabetes who presents to clinic for follow-up diabetes and erectile dysfunction. The patient was diagnosed with diabetes approximately 1 year ago. Currently, the patient is taking metformin 500mg  1x a day. This dose has been decreased from metformin 500mg  BID. Patient reports occasional diarrhea and upset stomach, but feels that medication has been fine otherwise. Took more hours at the job, walk about 34-35 miles a week with job and normal activity. Switched to non-sugar drinks altogether, eat less bread and cards. Went through a training class in a hospital to attend six classes on food nutrition in diabetes approximately 8-9 months ago. Eat smaller meals to level out the sugar. Checks blood glucose every morning, with the highest 130 in the past months with numbers as low as 90. Have not gone over 200 in the morning in 8-9 months.  Drinks coffee with creamer with less sugar in the coffee. Patient endorses that mostly sugary drinks (drinking sweet tea nonstop) contribute to diabetes. Saw an eye specialist in the past 6 months for diabetes care.   Patient is more concerned today with the erectile dysfunction. Saw a urologist who took a testosterone level (11am) that showed low testosterone. Has not been back to see urologist. Reports sex drive has not been reduced, and more of an erection problem. Been with a partner for the past three years and reports that it has been more of an sexual relationship than previous ones. Prescribe 10mg  of Cialis, but takes half a pill every three days because of no insurance and the supply is limited with Medical Management. Had to take antidepressants in the past  for a couple of months, with ED starting then. Reports that erection has not been the same since the antidepressant use. Finds Cialis increasing the erection, but would like to have more a sexual drive and have difficulty lasting during encounters.   Review of Systems   Constitutional: Negative for fatigue.  Eyes: Negative for visual disturbance.  Respiratory: Negative for shortness of breath.   Cardiovascular: Negative for chest pain.  Gastrointestinal: Negative for blood in stool, diarrhea and nausea.  Endocrine: Negative for polyuria.    Alexander [name]  has a past medical history of Bipolar 1 disorder (HCC), Herniated cervical disc, Neck pain, Obesity, Pollen allergies, and Type 2 diabetes mellitus (HCC).  Alexander [name] family history includes Breast cancer (age of onset: 96) in his mother; Colon cancer (age of onset: 7) in his mother.  Alexander [name]  reports that he has been smoking cigarettes. He has a 0.30 pack-year smoking history. He has never used smokeless tobacco. He reports current alcohol use. He reports that he does not use drugs.   Current Outpatient Medications:  .  atorvastatin (LIPITOR) 10 MG tablet, Take 1 tablet (10 mg total) by mouth daily., Disp: 30 tablet, Rfl: 0 .  glucose blood test strip, USE ONE STRIP tid to monitor blood glucose ACB, ACD and HS, Disp: 100 each, Rfl: 12 .  Lancets MISC, 1 Stick by Does not apply route 3 (three) times daily. USE ONE LANCET tid to monitor blood glucose ACB, ACD and HS, Disp: 90 each, Rfl: 3 .  metFORMIN (GLUCOPHAGE) 500 MG tablet, TAKE ONE TABLET BY MOUTH 2 TIMES A DAY WITH MEALS, Disp: 180 tablet, Rfl: 0 .  tadalafil (CIALIS) 10 MG tablet, Take 1 tablet (10 mg total) by mouth daily as needed for erectile dysfunction., Disp: 10 tablet, Rfl: 3  No Known Allergies Objective:    Physical Exam      Data/Results/Labs  : I have personally reviewed pertinent labs and imaging studies, if indicated,  with the patient in clinic today.  No results found for this or any previous visit (from the past 672 hour(s)).]  HC Readings from Last 3 Encounters:  No data found for [facility]    Wt Readings from Last 3 Encounters:  05/13/19 270 lb (122.5 kg)  09/10/18 259 lb 14.4 oz (117.9 kg)  06/04/18 257 lb 14.4  oz (117 kg)

## 2019-07-14 ENCOUNTER — Other Ambulatory Visit: Payer: Self-pay

## 2019-07-14 DIAGNOSIS — R7303 Prediabetes: Secondary | ICD-10-CM

## 2019-07-14 DIAGNOSIS — E1169 Type 2 diabetes mellitus with other specified complication: Secondary | ICD-10-CM

## 2019-07-14 DIAGNOSIS — N521 Erectile dysfunction due to diseases classified elsewhere: Secondary | ICD-10-CM

## 2019-07-16 LAB — COMPREHENSIVE METABOLIC PANEL
ALT: 29 IU/L (ref 0–44)
AST: 17 IU/L (ref 0–40)
Albumin/Globulin Ratio: 1.8 (ref 1.2–2.2)
Albumin: 4.3 g/dL (ref 4.0–5.0)
Alkaline Phosphatase: 102 IU/L (ref 39–117)
BUN/Creatinine Ratio: 16 (ref 9–20)
BUN: 15 mg/dL (ref 6–24)
Bilirubin Total: 0.3 mg/dL (ref 0.0–1.2)
CO2: 23 mmol/L (ref 20–29)
Calcium: 9.8 mg/dL (ref 8.7–10.2)
Chloride: 105 mmol/L (ref 96–106)
Creatinine, Ser: 0.92 mg/dL (ref 0.76–1.27)
GFR calc Af Amer: 116 mL/min/{1.73_m2} (ref >59)
GFR calc non Af Amer: 100 mL/min/{1.73_m2} (ref >59)
Globulin, Total: 2.4 g/dL (ref 1.5–4.5)
Glucose: 127 mg/dL — ABNORMAL HIGH (ref 65–99)
Potassium: 4.7 mmol/L (ref 3.5–5.2)
Sodium: 143 mmol/L (ref 134–144)
Total Protein: 6.7 g/dL (ref 6.0–8.5)

## 2019-07-16 LAB — HEMOGLOBIN A1C
Est. average glucose Bld gHb Est-mCnc: 137 mg/dL
Hgb A1c MFr Bld: 6.4 % — ABNORMAL HIGH (ref 4.8–5.6)

## 2019-07-16 LAB — LIPID PANEL
Chol/HDL Ratio: 4.3 ratio (ref 0.0–5.0)
Cholesterol, Total: 125 mg/dL (ref 100–199)
HDL: 29 mg/dL — ABNORMAL LOW (ref >39)
LDL Chol Calc (NIH): 57 mg/dL (ref 0–99)
Triglycerides: 243 mg/dL — ABNORMAL HIGH (ref 0–149)
VLDL Cholesterol Cal: 39 mg/dL (ref 5–40)

## 2019-07-16 LAB — FOLLICLE STIMULATING HORMONE: FSH: 7.2 m[IU]/mL (ref 1.5–12.4)

## 2019-07-16 LAB — TESTOSTERONE, FREE: Testosterone, Free: 6.7 pg/mL — ABNORMAL LOW (ref 6.8–21.5)

## 2019-07-16 LAB — PROLACTIN: Prolactin: 5.6 ng/mL (ref 4.0–15.2)

## 2019-07-16 LAB — TESTOSTERONE: Testosterone: 182 ng/dL — ABNORMAL LOW (ref 264–916)

## 2019-07-16 LAB — LUTEINIZING HORMONE: LH: 5.9 m[IU]/mL (ref 1.7–8.6)

## 2019-07-20 ENCOUNTER — Other Ambulatory Visit: Payer: Self-pay

## 2019-07-20 ENCOUNTER — Ambulatory Visit: Payer: Self-pay

## 2019-07-20 NOTE — Progress Notes (Unsigned)
Follow up Diabetes/ Endocrine [facility]     Patient ID: Alexander [name], male   DOB: 09-28-1970, 48 y.o.   MRN: [id number] Assessment:  Alexander [name] is a 48 y.o. male who is seen in follow up for No primary diagnosis found. at the request of Tukov-Yual Magdalene.  Encounter Diagnoses  Assessment/Plan:  1. Low libido, low testosterone Patient reports hx of low libido that has occurred for the past several years. Currently taking Cialis 10mg  every three days. Cialis has helped improve symptoms, but cannot take constant dose given limited access to Cialis (Medical Management will provide samples). Denies any hx of trauma, radiation or chemotherapy. Denies family hx of autoimmune, genetic conditions, or low testosterone. Denies any gynecomastia. Received a vasectomy ~15 years ago. Denies any change in testis size. Patient eager to address issue of low libido as it impacts quality of life. No height or weight on file, but last readings show weight of approx. 270 and height of approx. 93ft, for BMI of ~36. Labs on 07/14/2019 show low testosterone of 182 and low free testosterone of 6.7 at 0953 in setting of normal FSH, LH, and prolactin. Normal FSH, LH, and prolactin decreases the likelihood that low testosterone resulting from disturbances from HPG axis. Low testosterone can result from diabetes condition and obesity. - Discussed possibility of receiving testosterone shot, with PCP to manage the testosterone shot - PRIOR TO TESTOSTERONE shot, need to receive prostate and testicular physical exam, CBC, and PSA. If everything normal on physical exam and lab values, then prescribe testosterone shot. - f/u visit with [facility] in 3 months  2. T2DM Currently taking metformin 500mg  daily, with lifestyle changes. Reports having lost some weight. Recent lab result (07/14/2019) revealed an A1c of 6.4, well-controlled. Patient states dieting and exercise as tolerated. No history of UTI or kidney infections.  -  Cont. metformin 500mg  BID - A1c due 10/2018 - UACR, microalb due (last reading 06/2018)   There are no Patient Instructions on file for this visit.   No orders of the defined types were placed in this encounter.    Subjective:  Alexander [name] is a 48 y.o. male seen last month by Dr. Rich Reining for a new T2DM diagnosis and compliants of low libido. At that time, Dr. Rich Reining noted that "total testosterone somewhat low in spring, but not done at 8 am and may have been low due to obesity. Recommend getting 8 am total and free T levels, prolactin, and gonadotropins to more fully evaluate this. Will call schedule pt for next month for follow-up of this issue and review of his labs." This appointment is a follow up on Mr. Wicke' labs.   Patient denies having any hx of radiation or chemotherapy. No family history of low testosterone, autoimmune conditions, or genetic conditions. Vasectomy approx. 15 years ago. Current medications include metformin 500 mg daily, atorvastatin, Cialis.  Medical management can only provide Cialis samples: had 20mg  pills that were halved (10mg ) every three days. Does seem to help some with some improvement once started Cialis. No steroid use. Drinks 1-2 drinks a week on average (12-16oz beer, with some mixed drinks). Patient denies any abnormalities in the genitalia, or testes smaller than normal. Patient notes that a couple of years hehad a testicle that swell, but did not have a recurrence.      Review of Systems  Alexander [name]  has a past medical history of Bipolar 1 disorder ([place]), Herniated cervical disc, Neck pain, Obesity, Pollen allergies, and  Type 2 diabetes mellitus (HCC).  Family History, Social History, current Medications and allergies reviewed and updated in Epic.  Objective:    There were no vitals taken for this visit. Physical Exam      Data : I have personally reviewed pertinent labs and imaging studies, if indicated,  with the patient in clinic today.    Lab Orders  No laboratory test(s) ordered today    HC Readings from Last 3 Encounters:  No data found for [facility]    Wt Readings from Last 3 Encounters:  05/13/19 270 lb (122.5 kg)  09/10/18 259 lb 14.4 oz (117.9 kg)  06/04/18 257 lb 14.4 oz (117 kg)

## 2019-07-20 NOTE — Progress Notes (Incomplete)
Endocrinology Consult  Assessment/Plan:   Assessment and Plan:  Hyperlipidemia Recent lab results (07/14/2019) indicate second elevated triglycerides and low HDL as compared to January (137 and 33, respectively). Patient denies missing doses of 74m QD atorvastatin. Denies myalgias. Increase to 20 mg QD PO.   T2DM Recent lab result (07/14/2019) revealed an A1c of 6.4. Patient states dieting and exercise as tolerated. Consider adding Januvia or another SGLT-2 inhibitor for weight loss benefits. No history of UTI or kidney infections.   Low Libido and ED Recent labs indicate normal levels of FSH, LH, and prolactin. Total testosterone on the border of low abnormal and may be due to obesity. Consistent with level capture in February versus November 2020: 183 v. 182. Free testosterone was 6.7 (07/14/2019) -- age low normal border. TSH normal. Patient *** Cialis. PMH significant for Bipolar Type I disorder but *** medication use. Counseled weight loss and exercise.      Subjective:   History of Present Illness:  Alexander [name] a 48y.o. male seen last month by Dr. KRich Reiningfor a new T2DM diagnosis and compliants of low libido. At that time, Dr. KRich Reiningnoted that "total testosterone somewhat low in spring, but not done at 8 am and may have been low due to obesity. Recommend getting 8 am total and free T levels, prolactin, and gonadotropins to more fully evaluate this. Will call schedule pt for next month for follow-up of this issue and review of his labs." This appointment is a follow up on Alexander [name] labs.   Alexander [name] complicated by a medical history of hyperlipidemia, tobacco use, and obesity (There is no height or weight on file to calculate BMI. -- weight is 270 lb, heigh ~ 645f so BMI ~ 36.6).   Medical History:   Past Medical History:  Diagnosis Date   Bipolar 1 disorder ([facility]   Herniated cervical disc    C6   Neck pain    Obesity    Pollen allergies    Type 2  diabetes mellitus (HCC)      Past Surgical History:  Procedure Laterality Date   VASECTOMY       Current Outpatient Medications  Medication Sig Dispense Refill   atorvastatin (LIPITOR) 10 MG tablet Take 1 tablet (10 mg total) by mouth daily. 30 tablet 0   glucose blood test strip USE ONE STRIP tid to monitor blood glucose ACB, ACD and HS 100 each 12   Lancets MISC 1 Stick by Does not apply route 3 (three) times daily. USE ONE LANCET tid to monitor blood glucose ACB, ACD and HS 90 each 3   metFORMIN (GLUCOPHAGE) 500 MG tablet TAKE ONE TABLET BY MOUTH 2 TIMES A DAY WITH MEALS 180 tablet 0   tadalafil (CIALIS) 10 MG tablet Take 1 tablet (10 mg total) by mouth daily as needed for erectile dysfunction. 10 tablet 3   No current facility-administered medications for this visit.     No Known Allergies  Family History:  Family History  Problem Relation Age of Onset   Colon cancer Mother [phone] Breast cancer Mother [phone]  Objective:     Data Review:  Results for orders placed or performed in visit on 07/14/19  [facility] Ref Range   FSH 7.2 1.5 - 12.4 mIU/mL  LH  Result Value Ref Range   LH 5.9 1.7 - 8.6 mIU/mL  Prolactin  Result Value Ref Range   Prolactin 5.6 4.0 -  15.2 ng/mL  Testosterone, free  Result Value Ref Range   Testosterone, Free 6.7 (L) 6.8 - 21.5 pg/mL  Testosterone  Result Value Ref Range   Testosterone 182 (L) 264 - 916 ng/dL  HgB A1c  Result Value Ref Range   Hgb A1c MFr Bld 6.4 (H) 4.8 - 5.6 %   Est. average glucose Bld gHb Est-mCnc 137 mg/dL  Lipid Profile  Result Value Ref Range   Cholesterol, Total 125 100 - 199 mg/dL   Triglycerides 243 (H) 0 - 149 mg/dL   HDL 29 (L) >39 mg/dL   VLDL Cholesterol Cal 39 5 - 40 mg/dL   LDL Chol Calc (NIH) 57 0 - 99 mg/dL   Chol/HDL Ratio 4.3 0.0 - 5.0 ratio  Comp Met (CMET)  Result Value Ref Range   Glucose 127 (H) 65 - 99 mg/dL   BUN 15 6 - 24 mg/dL   Creatinine, Ser 0.92 0.76 - 1.27 mg/dL    GFR calc non Af Amer 100 >59 mL/min/1.73   GFR calc Af Amer 116 >59 mL/min/1.73   BUN/Creatinine Ratio 16 9 - 20   Sodium 143 134 - 144 mmol/L   Potassium 4.7 3.5 - 5.2 mmol/L   Chloride 105 96 - 106 mmol/L   CO2 23 20 - 29 mmol/L   Calcium 9.8 8.7 - 10.2 mg/dL   Total Protein 6.7 6.0 - 8.5 g/dL   Albumin 4.3 4.0 - 5.0 g/dL   Globulin, Total 2.4 1.5 - 4.5 g/dL   Albumin/Globulin Ratio 1.8 1.2 - 2.2   Bilirubin Total 0.3 0.0 - 1.2 mg/dL   Alkaline Phosphatase 102 39 - 117 IU/L   AST 17 0 - 40 IU/L   ALT 29 0 - 44 IU/L

## 2019-07-28 ENCOUNTER — Other Ambulatory Visit: Payer: Self-pay

## 2019-07-28 ENCOUNTER — Encounter: Payer: Self-pay | Admitting: Emergency Medicine

## 2019-07-28 ENCOUNTER — Emergency Department
Admission: EM | Admit: 2019-07-28 | Discharge: 2019-07-28 | Disposition: A | Discharge status: Home / Self Care | Payer: Self-pay | Attending: Emergency Medicine | Admitting: Emergency Medicine

## 2019-07-28 ENCOUNTER — Emergency Department: Payer: Self-pay

## 2019-07-28 DIAGNOSIS — F17210 Nicotine dependence, cigarettes, uncomplicated: Secondary | ICD-10-CM | POA: Insufficient documentation

## 2019-07-28 DIAGNOSIS — E119 Type 2 diabetes mellitus without complications: Secondary | ICD-10-CM | POA: Insufficient documentation

## 2019-07-28 DIAGNOSIS — Z79899 Other long term (current) drug therapy: Secondary | ICD-10-CM | POA: Insufficient documentation

## 2019-07-28 DIAGNOSIS — E1140 Type 2 diabetes mellitus with diabetic neuropathy, unspecified: Secondary | ICD-10-CM | POA: Insufficient documentation

## 2019-07-28 DIAGNOSIS — Z7984 Long term (current) use of oral hypoglycemic drugs: Secondary | ICD-10-CM | POA: Insufficient documentation

## 2019-07-28 DIAGNOSIS — M5432 Sciatica, left side: Secondary | ICD-10-CM | POA: Insufficient documentation

## 2019-07-28 MED ORDER — TRAMADOL HCL 50 MG PO TABS
50.0000 mg | ORAL_TABLET | Freq: Once | ORAL | Status: AC
  Administered 2019-07-28: 12:00:00 50 mg via ORAL
  Filled 2019-07-28: qty 1

## 2019-07-28 MED ORDER — CYCLOBENZAPRINE HCL 10 MG PO TABS: 10.0000 mg | ORAL_TABLET | Freq: Three times a day (TID) | ORAL | 0 refills | Status: AC | PRN

## 2019-07-28 MED ORDER — METHYLPREDNISOLONE 4 MG PO TBPK: ORAL_TABLET | ORAL | 0 refills | Status: DC

## 2019-07-28 MED ORDER — PREDNISONE 20 MG PO TABS
60.0000 mg | ORAL_TABLET | Freq: Once | ORAL | Status: AC
  Administered 2019-07-28: 12:00:00 60 mg via ORAL
  Filled 2019-07-28: qty 3

## 2019-07-28 MED ORDER — TRAMADOL HCL 50 MG PO TABS: 50.0000 mg | ORAL_TABLET | Freq: Four times a day (QID) | ORAL | 0 refills | Status: DC | PRN

## 2019-07-28 MED ORDER — CYCLOBENZAPRINE HCL 10 MG PO TABS
10.0000 mg | ORAL_TABLET | Freq: Once | ORAL | Status: AC
  Administered 2019-07-28: 10 mg via ORAL
  Filled 2019-07-28: qty 1

## 2019-07-28 NOTE — ED Triage Notes (Signed)
PT to ED with pain in left lower back and hip. Hx of sciatica that pt reports feels the same today. Pain is worse with movement. Massage and antiinflammatories attempted at home without relief.

## 2019-07-28 NOTE — ED Provider Notes (Signed)
[facility] Emergency Department Provider Note   ____________________________________________   First MD Initiated Contact with Patient 07/28/19 1118     (approximate)  I have reviewed the triage vital signs and the nursing notes.   HISTORY  Chief Complaint Sciatica    HPI Alexander [name] is a 48 y.o. male patient complaining of low back pain for the component to the left lower extremity.  Patient denies bladder bowel dysfunction.  Patient states this muscle spasm with flexion and extension the lumbar spine.  Patient stated no relief with massage and anti-inflammatory medications taken at home.  Patient states similar episode 2 years ago that resolved with without medical intervention.  No recent lumbar x-rays or MRIs.  Patient rates his pain as a 3/10.  Patient described the pain is spasmatic/tingling".  History of cervical spine H&P.         Past Medical History:  Diagnosis Date  . Bipolar 1 disorder (HCC)   . Herniated cervical disc    C6  . Neck pain   . Obesity   . Pollen allergies   . Type 2 diabetes mellitus [facility](HCC)     Patient Active Problem List   Diagnosis Date Noted  . Type 2 diabetes mellitus with diabetic neuropathy, without long-term current use of insulin (HCC) 09/12/2018  . Obstructive sleep apnea on CPAP 01/18/2013  . Family history of colon cancer 12/11/2011  . Inadequate sleep hygiene 12/09/2011  . Bipolar 1 disorder (HCC) 03/29/2010  . OBESITY 11/08/2008  . ALLERGIC RHINITIS 11/08/2008  . NECK PAIN, CHRONIC 11/08/2008  . FATIGUE 11/08/2008    Past Surgical History:  Procedure Laterality Date  . VASECTOMY      Prior to Admission medications   Medication Sig Start Date End Date Taking? Authorizing Provider  atorvastatin (LIPITOR) 10 MG tablet Take 1 tablet (10 mg total) by mouth daily. 05/13/19   Tukov-Yual, Alroy BailiffMagdalene S, NP  cyclobenzaprine (FLEXERIL) 10 MG tablet Take 1 tablet (10 mg total) by mouth 3 (three) times daily  as needed. 07/28/19   Joni ReiningSmith, Anjolie Majer K, PA-C  glucose blood test strip USE ONE STRIP tid to monitor blood glucose ACB, ACD and HS 05/13/19   Tukov-Yual, Magdalene S, NP  Lancets MISC 1 Stick by Does not apply route 3 (three) times daily. USE ONE LANCET tid to monitor blood glucose ACB, ACD and HS 05/13/19   Tukov-Yual, Magdalene S, NP  metFORMIN (GLUCOPHAGE) 500 MG tablet TAKE ONE TABLET BY MOUTH 2 TIMES A DAY WITH MEALS 05/25/19   Tukov-Yual, Alroy BailiffMagdalene S, NP  methylPREDNISolone (MEDROL DOSEPAK) 4 MG TBPK tablet Take Tapered dose as directed 07/28/19   Joni ReiningSmith, Anayelli Lai K, PA-C  tadalafil (CIALIS) 10 MG tablet Take 1 tablet (10 mg total) by mouth daily as needed for erectile dysfunction. 05/13/19   Tukov-Yual, Alroy BailiffMagdalene S, NP  traMADol (ULTRAM) 50 MG tablet Take 1 tablet (50 mg total) by mouth every 6 (six) hours as needed. 07/28/19 07/27/20  Joni ReiningSmith, Tony Friscia K, PA-C    Allergies Patient has no known allergies.  Family History  Problem Relation Age of Onset  . Colon cancer Mother [phone]  . Breast cancer Mother [phone]    Social History Social History   Tobacco Use  . Smoking status: Current Every Day Smoker    Packs/day: 0.10    Years: 3.00    Pack years: 0.30    Types: Cigarettes    Last attempt to quit: 09/02/1996    Years since quitting: 22.9  . Smokeless  tobacco: Never Used  . Tobacco comment: only smoked socially through college.  Substance Use Topics  . Alcohol use: Yes    Comment: 2 beers a week  . Drug use: No    Comment: OCCASIONAL MJ    Review of Systems Constitutional: No fever/chills Eyes: No visual changes. ENT: No sore throat. Cardiovascular: Denies chest pain. Respiratory: Denies shortness of breath. Gastrointestinal: No abdominal pain.  No nausea, no vomiting.  No diarrhea.  No constipation. Genitourinary: Negative for dysuria. Musculoskeletal: Negative for back pain. Skin: Negative for rash. Neurological: Negative for headaches, focal weakness or numbness. Endocrine:   Diabetes ____________________________________________   PHYSICAL EXAM:  VITAL SIGNS: ED Triage Vitals  Enc Vitals Group     BP 07/28/19 1101 (!) 165/96     Pulse Rate 07/28/19 1101 96     Resp 07/28/19 1101 20     Temp 07/28/19 1101 98 F (36.7 C)     Temp Source 07/28/19 1101 Oral     SpO2 07/28/19 1101 96 %     Weight 07/28/19 1101 270 lb (122.5 kg)     Height 07/28/19 1101 6' (1.829 m)     Head Circumference --      Peak Flow --      Pain Score 07/28/19 1102 3     Pain Loc --      Pain Edu? --      Excl. in [place]? --    Constitutional: Alert and oriented. Well appearing and in no acute distress. Neck: No cervical spine tenderness to palpation. Cardiovascular: Normal rate, regular rhythm. Grossly normal heart sounds.  Good peripheral circulation.  Elevated blood pressure. Respiratory: Normal respiratory effort.  No retractions. Lungs CTAB. Gastrointestinal: Soft and nontender. No distention. No abdominal bruits. No CVA tenderness.  No guarding palpation spinal processes.  Patient has bilateral paraspinal muscle spasms.  Patient has positive straight leg test on the left side in the supine position.  Musculoskeletal: Patient assistance and her reliance on upper extremities.  No obvious deformity to the lumbar spine.   Neurologic:  Normal speech and language. No gross focal neurologic deficits are appreciated. No gait instability. Skin:  Skin is warm, dry and intact. No rash noted. Psychiatric: Mood and affect are normal. Speech and behavior are normal.  ____________________________________________   LABS (all labs ordered are listed, but only abnormal results are displayed)  Labs Reviewed - No data to display ____________________________________________  EKG   ____________________________________________  RADIOLOGY  ED MD interpretation:    Official radiology report(s): Dg Lumbar Spine 2-3 Views  Result Date: 07/28/2019 CLINICAL DATA:  Left lower back pain  radiating into the left hip. EXAM: LUMBAR SPINE - 2-3 VIEW COMPARISON:  None. FINDINGS: Five lumbar type vertebral bodies. No acute fracture or subluxation. Vertebral body heights are preserved. 5 mm retrolisthesis at L2-L3. Moderate disc height loss at L2-L3. Mild disc height loss at L4-L5 and L5-S1. The sacroiliac joints are unremarkable. IMPRESSION: 1. Multilevel lumbar spondylosis as described above, moderate at L2-L3. Electronically Signed   By: Titus Dubin M.D.   On: 07/28/2019 12:11    ____________________________________________   PROCEDURES  Procedure(s) performed (including Critical Care):  Procedures   ____________________________________________   INITIAL IMPRESSION / ASSESSMENT AND PLAN / ED COURSE  As part of my medical decision making, I reviewed the following data within the [facility]     Patient presents with back pain for the component to the left lower extremity for 1 week.  Refractory to  conservative therapy.  Physical exam consistent with sciatica involving the left lower extremity.  Discussed x-ray findings with patient.  Patient given discharge care instruction advised take medication as directed.  Patient advised follow-up PCP.    Alexander [name] was evaluated in Emergency Department on 07/28/2019 for the symptoms described in the history of present illness. He was evaluated in the context of the global COVID-19 pandemic, which necessitated consideration that the patient might be at risk for infection with the SARS-CoV-2 virus that causes COVID-19. Institutional protocols and algorithms that pertain to the evaluation of patients at risk for COVID-19 are in a state of rapid change based on information released by regulatory bodies including the CDC and federal and state organizations. These policies and algorithms were followed during the patient's care in the ED.       ____________________________________________   FINAL CLINICAL  IMPRESSION(S) / ED DIAGNOSES  Final diagnoses:  Sciatica of left side     ED Discharge Orders         Ordered    traMADol (ULTRAM) 50 MG tablet  Every 6 hours PRN     07/28/19 1226    methylPREDNISolone (MEDROL DOSEPAK) 4 MG TBPK tablet     07/28/19 1226    cyclobenzaprine (FLEXERIL) 10 MG tablet  3 times daily PRN     07/28/19 1226           Note:  This document was prepared using Dragon voice recognition software and may include unintentional dictation errors.    Joni Reining, PA-C 07/28/19 1229    Minna Antis, MD 07/28/19 [phone]

## 2019-07-28 NOTE — Discharge Instructions (Addendum)
Follow discharge care instruction take medication as directed.  Start steroids tomorrow morning.

## 2019-08-05 ENCOUNTER — Other Ambulatory Visit: Payer: Self-pay

## 2019-08-05 ENCOUNTER — Ambulatory Visit: Payer: Self-pay | Admitting: Adult Health

## 2019-08-05 DIAGNOSIS — N521 Erectile dysfunction due to diseases classified elsewhere: Secondary | ICD-10-CM

## 2019-08-05 DIAGNOSIS — E785 Hyperlipidemia, unspecified: Secondary | ICD-10-CM

## 2019-08-05 DIAGNOSIS — E1140 Type 2 diabetes mellitus with diabetic neuropathy, unspecified: Secondary | ICD-10-CM

## 2019-08-05 DIAGNOSIS — E1169 Type 2 diabetes mellitus with other specified complication: Secondary | ICD-10-CM

## 2019-08-05 NOTE — Progress Notes (Signed)
Patient: Kaidin Boehle Male    DOB: Mar 13, 1971   48 y.o.   MRN: [id number] Visit Date: 08/05/2019   Patient consents to Telephone/or Telehealth visit and two identifiers have been used to establish patient's identity prior to visit  Today's Provider: Deforest Hoyles, NP   No chief complaint on file.  Subjective:    HPI: This is 48 year old male seen for follow-up of type 2 diabetes and erectile dysfunction and review of testosterone results.  He states that he still feels fatigued and has a low sexual drive.  His testosterone level was low and it was recommended that he be placed on testosterone shots.  He reports persistent ED.  He has not yet been seen by endocrinology.  He reports good blood sugar control.  He he is on Metformin and reports checking his blood sugar at least twice a day.  He reports exercising and adhering to a low carb diet as well  No Known Allergies Previous Medications   ATORVASTATIN (LIPITOR) 10 MG TABLET    Take 1 tablet (10 mg total) by mouth daily.   CYCLOBENZAPRINE (FLEXERIL) 10 MG TABLET    Take 1 tablet (10 mg total) by mouth 3 (three) times daily as needed.   GLUCOSE BLOOD TEST STRIP    USE ONE STRIP tid to monitor blood glucose ACB, ACD and HS   LANCETS MISC    1 Stick by Does not apply route 3 (three) times daily. USE ONE LANCET tid to monitor blood glucose ACB, ACD and HS   METFORMIN (GLUCOPHAGE) 500 MG TABLET    TAKE ONE TABLET BY MOUTH 2 TIMES A DAY WITH MEALS   METHYLPREDNISOLONE (MEDROL DOSEPAK) 4 MG TBPK TABLET    Take Tapered dose as directed   TADALAFIL (CIALIS) 10 MG TABLET    Take 1 tablet (10 mg total) by mouth daily as needed for erectile dysfunction.   TRAMADOL (ULTRAM) 50 MG TABLET    Take 1 tablet (50 mg total) by mouth every 6 (six) hours as needed.    Review of Systems  Constitutional: Positive for fatigue.  Respiratory: Negative.   Cardiovascular: Negative.   Gastrointestinal: Negative.   Endocrine: Negative.   Genitourinary:  Negative for discharge, penile pain, scrotal swelling and testicular pain.       Erectile dysfunction  Skin: Negative.     Social History   Tobacco Use  . Smoking status: Current Every Day Smoker    Packs/day: 0.10    Years: 3.00    Pack years: 0.30    Types: Cigarettes    Last attempt to quit: 09/02/1996    Years since quitting: 22.9  . Smokeless tobacco: Never Used  . Tobacco comment: only smoked socially through college.  Substance Use Topics  . Alcohol use: Yes    Comment: 2 beers a week   Objective:   There were no vitals taken for this visit.  Physical Exam  Unable to obtain as this is a telephonic visit    Assessment & Plan:  1. Type 2 diabetes mellitus with other specified complication, without long-term current use of insulin (HCC) Continue current medications.  2. Erectile disorder due to medical condition in male Testosterone level is low but he does not have other associated test to determine if he nearly needs testosterone shots at this time.  Will refer to endocrinology  3. Type 2 diabetes mellitus with diabetic neuropathy, without long-term current use of insulin (HCC) Continue current medications.  Signs and symptoms of hypo  and hyperglycemia reviewed  4. Morbid obesity (HCC) Continue weight loss efforts as this will help with erectile dysfunction, glycemic control and testosterone levels.  5. Hyperlipidemia associated with type 2 diabetes mellitus (HCC) Continue current medications.  Patient advised to adhere to a low-fat diet.  Shawn Route, NP   [facility]

## 2019-08-12 ENCOUNTER — Ambulatory Visit: Payer: Self-pay | Admitting: Adult Health

## 2019-09-14 ENCOUNTER — Other Ambulatory Visit: Payer: Self-pay

## 2019-09-14 DIAGNOSIS — E782 Mixed hyperlipidemia: Secondary | ICD-10-CM

## 2019-09-14 MED ORDER — ATORVASTATIN CALCIUM 10 MG PO TABS: 10.0000 mg | ORAL_TABLET | Freq: Every day | ORAL | 0 refills | Status: DC

## 2019-11-01 ENCOUNTER — Other Ambulatory Visit: Payer: Self-pay | Admitting: Gerontology

## 2019-11-01 DIAGNOSIS — E782 Mixed hyperlipidemia: Secondary | ICD-10-CM

## 2019-11-18 ENCOUNTER — Other Ambulatory Visit: Payer: Self-pay

## 2019-11-25 ENCOUNTER — Ambulatory Visit: Payer: Self-pay | Admitting: Gerontology

## 2019-11-30 ENCOUNTER — Other Ambulatory Visit: Payer: Self-pay

## 2019-11-30 ENCOUNTER — Other Ambulatory Visit: Payer: Self-pay | Admitting: Gerontology

## 2019-11-30 DIAGNOSIS — N521 Erectile dysfunction due to diseases classified elsewhere: Secondary | ICD-10-CM

## 2019-11-30 MED ORDER — TADALAFIL 10 MG PO TABS: 10.0000 mg | ORAL_TABLET | Freq: Every day | ORAL | 0 refills | Status: AC | PRN

## 2019-12-01 ENCOUNTER — Other Ambulatory Visit: Payer: Self-pay

## 2019-12-01 DIAGNOSIS — E785 Hyperlipidemia, unspecified: Secondary | ICD-10-CM

## 2019-12-01 DIAGNOSIS — N521 Erectile dysfunction due to diseases classified elsewhere: Secondary | ICD-10-CM

## 2019-12-01 DIAGNOSIS — E1169 Type 2 diabetes mellitus with other specified complication: Secondary | ICD-10-CM

## 2019-12-01 DIAGNOSIS — E1140 Type 2 diabetes mellitus with diabetic neuropathy, unspecified: Secondary | ICD-10-CM

## 2019-12-02 ENCOUNTER — Other Ambulatory Visit: Payer: Self-pay

## 2019-12-09 ENCOUNTER — Ambulatory Visit: Payer: Self-pay | Admitting: Gerontology

## 2019-12-16 ENCOUNTER — Other Ambulatory Visit: Payer: Self-pay

## 2019-12-16 VITALS — BP 156/104 | HR 88 | Temp 97.9°F | Ht 72.0 in | Wt 282.1 lb

## 2019-12-16 DIAGNOSIS — N521 Erectile dysfunction due to diseases classified elsewhere: Secondary | ICD-10-CM

## 2019-12-16 NOTE — Progress Notes (Signed)
cbc

## 2019-12-20 ENCOUNTER — Telehealth: Payer: Self-pay

## 2019-12-20 LAB — COMPREHENSIVE METABOLIC PANEL
ALT: 39 IU/L (ref 0–44)
AST: 26 IU/L (ref 0–40)
Albumin/Globulin Ratio: 1.7 (ref 1.2–2.2)
Albumin: 4.5 g/dL (ref 4.0–5.0)
Alkaline Phosphatase: 96 IU/L (ref 39–117)
BUN/Creatinine Ratio: 24 — ABNORMAL HIGH (ref 9–20)
BUN: 20 mg/dL (ref 6–24)
Bilirubin Total: 0.3 mg/dL (ref 0.0–1.2)
CO2: 21 mmol/L (ref 20–29)
Calcium: 9.2 mg/dL (ref 8.7–10.2)
Chloride: 103 mmol/L (ref 96–106)
Creatinine, Ser: 0.84 mg/dL (ref 0.76–1.27)
GFR calc Af Amer: 121 mL/min/{1.73_m2} (ref >59)
GFR calc non Af Amer: 105 mL/min/{1.73_m2} (ref >59)
Globulin, Total: 2.7 g/dL (ref 1.5–4.5)
Glucose: 107 mg/dL — ABNORMAL HIGH (ref 65–99)
Potassium: 4.4 mmol/L (ref 3.5–5.2)
Sodium: 138 mmol/L (ref 134–144)
Total Protein: 7.2 g/dL (ref 6.0–8.5)

## 2019-12-20 LAB — CBC WITH DIFFERENTIAL/PLATELET
Basophils Absolute: 0.1 10*3/uL (ref 0.0–0.2)
Basos: 1 %
EOS (ABSOLUTE): 0.3 10*3/uL (ref 0.0–0.4)
Eos: 3 %
Hematocrit: 46.4 % (ref 37.5–51.0)
Hemoglobin: 16.1 g/dL (ref 13.0–17.7)
Immature Grans (Abs): 0 10*3/uL (ref 0.0–0.1)
Immature Granulocytes: 1 %
Lymphocytes Absolute: 3.7 10*3/uL — ABNORMAL HIGH (ref 0.7–3.1)
Lymphs: 44 %
MCH: 30.2 pg (ref 26.6–33.0)
MCHC: 34.7 g/dL (ref 31.5–35.7)
MCV: 87 fL (ref 79–97)
Monocytes Absolute: 0.6 10*3/uL (ref 0.1–0.9)
Monocytes: 8 %
Neutrophils Absolute: 3.5 10*3/uL (ref 1.4–7.0)
Neutrophils: 43 %
Platelets: 263 10*3/uL (ref 150–450)
RBC: 5.33 x10E6/uL (ref 4.14–5.80)
RDW: 12.5 % (ref 11.6–15.4)
WBC: 8.2 10*3/uL (ref 3.4–10.8)

## 2019-12-20 LAB — SPECIMEN STATUS REPORT

## 2019-12-20 LAB — LIPID PANEL
Chol/HDL Ratio: 4 ratio (ref 0.0–5.0)
Cholesterol, Total: 124 mg/dL (ref 100–199)
HDL: 31 mg/dL — ABNORMAL LOW (ref >39)
LDL Chol Calc (NIH): 61 mg/dL (ref 0–99)
Triglycerides: 191 mg/dL — ABNORMAL HIGH (ref 0–149)
VLDL Cholesterol Cal: 32 mg/dL (ref 5–40)

## 2019-12-20 LAB — PSA: Prostate Specific Ag, Serum: 0.5 ng/mL (ref 0.0–4.0)

## 2019-12-20 LAB — HEMOGLOBIN A1C
Est. average glucose Bld gHb Est-mCnc: 143 mg/dL
Hgb A1c MFr Bld: 6.6 % — ABNORMAL HIGH (ref 4.8–5.6)

## 2019-12-20 NOTE — Telephone Encounter (Signed)
LVM for Autumn at [facility] that testosterone test 6 digit codes are [id number] and [phone].

## 2019-12-22 LAB — SPECIMEN STATUS REPORT

## 2019-12-22 LAB — TESTOSTERONE: Testosterone: 118 ng/dL — ABNORMAL LOW (ref 264–916)

## 2019-12-22 LAB — TESTOSTERONE, FREE: Testosterone, Free: 2.3 pg/mL — ABNORMAL LOW (ref 6.8–21.5)

## 2019-12-23 ENCOUNTER — Ambulatory Visit: Payer: Self-pay | Admitting: Gerontology

## 2019-12-23 ENCOUNTER — Other Ambulatory Visit: Payer: Self-pay

## 2019-12-23 ENCOUNTER — Encounter: Payer: Self-pay | Admitting: Gerontology

## 2019-12-23 VITALS — BP 147/89 | HR 88 | Ht 72.0 in | Wt 283.6 lb

## 2019-12-23 DIAGNOSIS — K0889 Other specified disorders of teeth and supporting structures: Secondary | ICD-10-CM

## 2019-12-23 DIAGNOSIS — I10 Essential (primary) hypertension: Secondary | ICD-10-CM | POA: Insufficient documentation

## 2019-12-23 DIAGNOSIS — N529 Male erectile dysfunction, unspecified: Secondary | ICD-10-CM

## 2019-12-23 DIAGNOSIS — E119 Type 2 diabetes mellitus without complications: Secondary | ICD-10-CM | POA: Insufficient documentation

## 2019-12-23 MED ORDER — METFORMIN HCL 500 MG PO TABS: ORAL_TABLET | ORAL | 0 refills | Status: AC

## 2019-12-23 NOTE — Progress Notes (Signed)
Established Patient Office Visit  Subjective:  Patient ID: Alexander [name], male    DOB: December 10, 1970  Age: 49 y.o. MRN: [id number]  CC: No chief complaint on file.   HPI Alexander [name] presents for follow up of T2DM , Erectile dysfunction, and lab review. He states that he's compliant with his medications. His blood pressure during visit was elevated at 147/89, he denies chest pain, headache, and blurry vision. He states that he smokes 1 pack of cigarette daily and admits the desire to quit. His HgbA1c done on 12/16/2019 increased from 6.4% to 6.6% and he continues to take 500 mg Metformin, checks his fasting blood glucose weekly, states that it's usually less than 120 mg/dl. He denies peripheral neuropathy and performs daily foot check. His Free testosterone done on 12/16/2019 was 2.3 and Testosterone was 118 ng/dl, he states that he continues to experience erectile dysfunction. He reports losing one of his left upper tooth, denies pain and swelling, but requests dental follow up. Overall, he states that he's doing well and offers no further complaint.  Past Medical History:  Diagnosis Date  . Bipolar 1 disorder (HCC)   . Herniated cervical disc    C6  . Neck pain   . Obesity   . Pollen allergies   . Type 2 diabetes mellitus (HCC)     Past Surgical History:  Procedure Laterality Date  . VASECTOMY      Family History  Problem Relation Age of Onset  . Colon cancer Mother 53  . Breast cancer Mother 72    Social History   Socioeconomic History  . Marital status: Married    Spouse name: Not on file  . Number of children: 0  . Years of education: Not on file  . Highest education level: Not on file  Occupational History  . Occupation: MUSICIAN  Tobacco Use  . Smoking status: Current Every Day Smoker    Packs/day: 0.10    Years: 3.00    Pack years: 0.30    Types: Cigarettes    Last attempt to quit: 09/02/1996    Years since quitting: 23.3  . Smokeless tobacco: Never Used  .  Tobacco comment: only smoked socially through college.  Substance and Sexual Activity  . Alcohol use: Yes    Comment: 2 beers a week  . Drug use: No    Comment: OCCASIONAL MJ  . Sexual activity: Not on file  Other Topics Concern  . Not on file  Social History Narrative  . Not on file   Social Determinants of Health   Financial Resource Strain:   . Difficulty of Paying Living Expenses:   Food Insecurity:   . Worried About Programme researcher, broadcasting/film/video in the Last Year:   . Barista in the Last Year:   Transportation Needs:   . Freight forwarder (Medical):   Marland Kitchen Lack of Transportation (Non-Medical):   Physical Activity:   . Days of Exercise per Week:   . Minutes of Exercise per Session:   Stress:   . Feeling of Stress :   Social Connections:   . Frequency of Communication with Friends and Family:   . Frequency of Social Gatherings with Friends and Family:   . Attends Religious Services:   . Active Member of Clubs or Organizations:   . Attends Banker Meetings:   Marland Kitchen Marital Status:   Intimate Partner Violence:   . Fear of Current or Ex-Partner:   . Emotionally Abused:   .  Physically Abused:   . Sexually Abused:     Outpatient Medications Prior to Visit  Medication Sig Dispense Refill  . atorvastatin (LIPITOR) 10 MG tablet TAKE ONE TABLET BY MOUTH EVERY DAY 30 tablet 0  . tadalafil (CIALIS) 10 MG tablet Take 1 tablet (10 mg total) by mouth daily as needed for erectile dysfunction. 10 tablet 0  . metFORMIN (GLUCOPHAGE) 500 MG tablet TAKE ONE TABLET BY MOUTH 2 TIMES A DAY WITH MEALS 180 tablet 0  . cyclobenzaprine (FLEXERIL) 10 MG tablet Take 1 tablet (10 mg total) by mouth 3 (three) times daily as needed. (Patient not taking: Reported on 12/23/2019) 15 tablet 0  . glucose blood test strip USE ONE STRIP tid to monitor blood glucose ACB, ACD and HS (Patient not taking: Reported on 12/23/2019) 100 each 12  . Lancets MISC 1 Stick by Does not apply route 3 (three)  times daily. USE ONE LANCET tid to monitor blood glucose ACB, ACD and HS (Patient not taking: Reported on 12/23/2019) 90 each 3  . methylPREDNISolone (MEDROL DOSEPAK) 4 MG TBPK tablet Take Tapered dose as directed 21 tablet 0  . traMADol (ULTRAM) 50 MG tablet Take 1 tablet (50 mg total) by mouth every 6 (six) hours as needed. (Patient not taking: Reported on 12/23/2019) 20 tablet 0   No facility-administered medications prior to visit.    No Known Allergies  ROS Review of Systems  Constitutional: Negative.   Eyes: Negative.   Respiratory: Negative.   Cardiovascular: Negative.   Endocrine: Negative.   Neurological: Negative.   Psychiatric/Behavioral: Negative.       Objective:    Physical Exam  Constitutional: He is oriented to person, place, and time. He appears well-developed.  HENT:  Head: Normocephalic and atraumatic.  Eyes: Pupils are equal, round, and reactive to light. EOM are normal.  Cardiovascular: Normal rate and regular rhythm.  Pulmonary/Chest: Effort normal and breath sounds normal.  Neurological: He is alert and oriented to person, place, and time.  Psychiatric: He has a normal mood and affect. His behavior is normal. Judgment and thought content normal.    BP (!) 147/89   Pulse 88   Ht 6' (1.829 m)   Wt 283 lb 9.6 oz (128.6 kg)   SpO2 96%   BMI 38.46 kg/m  Wt Readings from Last 3 Encounters:  12/23/19 283 lb 9.6 oz (128.6 kg)  12/16/19 282 lb 1.6 oz (128 kg)  07/28/19 270 lb (122.5 kg)   He was advised to continue on a weight loss regimen.  Health Maintenance Due  Topic Date Due  . HIV Screening  Never done  . COVID-19 Vaccine (1) Never done  . TETANUS/TDAP  Never done  . URINE MICROALBUMIN  06/05/2019  . OPHTHALMOLOGY EXAM  06/19/2019    There are no preventive care reminders to display for this patient.  Lab Results  Component Value Date   TSH 1.610 06/04/2018   Lab Results  Component Value Date   WBC 8.2 12/16/2019   HGB 16.1  12/16/2019   HCT 46.4 12/16/2019   MCV 87 12/16/2019   PLT 263 12/16/2019   Lab Results  Component Value Date   NA 138 12/16/2019   K 4.4 12/16/2019   CO2 21 12/16/2019   GLUCOSE 107 (H) 12/16/2019   BUN 20 12/16/2019   CREATININE 0.84 12/16/2019   BILITOT 0.3 12/16/2019   ALKPHOS 96 12/16/2019   AST 26 12/16/2019   ALT 39 12/16/2019   PROT 7.2 12/16/2019  ALBUMIN 4.5 12/16/2019   CALCIUM 9.2 12/16/2019   ANIONGAP 12 04/10/2018   GFR 100.91 12/28/2012   Lab Results  Component Value Date   CHOL 124 12/16/2019   Lab Results  Component Value Date   HDL 31 (L) 12/16/2019   Lab Results  Component Value Date   LDLCALC 61 12/16/2019   Lab Results  Component Value Date   TRIG 191 (H) 12/16/2019   Lab Results  Component Value Date   CHOLHDL 4.0 12/16/2019   Lab Results  Component Value Date   HGBA1C 6.6 (H) 12/16/2019      Assessment & Plan:    1. Erectile dysfunction, unspecified erectile dysfunction type - He will follow up with Urologist Dr Jonny Ruiz on 01/03/2020  2. Tooth ache - He was provided with Dental application and was advised to notify clinic for worsening symptoms.  3. Type 2 diabetes mellitus without complication, without long-term current use of insulin (HCC) -His HgbA1c was 6.6%, he deferred his Metformin to be increased to 500 mg bid stating that he will adhere to ADA diet , exercise more and lose weight. He was advised to check blood glucose daily, record and bring log to follow up appointment, and continue on low carb/non concentrated sweet diet. - metFORMIN (GLUCOPHAGE) 500 MG tablet; TAKE ONE TABLET BY Mouth once A DAY WITH MEALS  Dispense: 180 tablet; Refill: 0 - Ambulatory referral to Ophthalmology  4. Essential hypertension - He was advised to check his blood pressure, record and bring log to follow up appointment in June. He was also advised to continue on DASH diet and exercise as tolerated.    Follow-up: Return in about 6 weeks  (around 02/03/2020), or if symptoms worsen or fail to improve.    Briannia Laba Jerold Coombe, NP

## 2019-12-23 NOTE — Patient Instructions (Signed)
DASH Eating Plan DASH stands for "Dietary Approaches to Stop Hypertension." The DASH eating plan is a healthy eating plan that has been shown to reduce high blood pressure (hypertension). It may also reduce your risk for type 2 diabetes, heart disease, and stroke. The DASH eating plan may also help with weight loss. What are tips for following this plan?  General guidelines  Avoid eating more than 2,300 mg (milligrams) of salt (sodium) a day. If you have hypertension, you may need to reduce your sodium intake to 1,500 mg a day.  Limit alcohol intake to no more than 1 drink a day for nonpregnant women and 2 drinks a day for men. One drink equals 12 oz of beer, 5 oz of wine, or 1 oz of hard liquor.  Work with your health care provider to maintain a healthy body weight or to lose weight. Ask what an ideal weight is for you.  Get at least 30 minutes of exercise that causes your heart to beat faster (aerobic exercise) most days of the week. Activities may include walking, swimming, or biking.  Work with your health care provider or diet and nutrition specialist (dietitian) to adjust your eating plan to your individual calorie needs. Reading food labels   Check food labels for the amount of sodium per serving. Choose foods with less than 5 percent of the Daily Value of sodium. Generally, foods with less than 300 mg of sodium per serving fit into this eating plan.  To find whole grains, look for the word "whole" as the first word in the ingredient list. Shopping  Buy products labeled as "low-sodium" or "no salt added."  Buy fresh foods. Avoid canned foods and premade or frozen meals. Cooking  Avoid adding salt when cooking. Use salt-free seasonings or herbs instead of table salt or sea salt. Check with your health care provider or pharmacist before using salt substitutes.  Do not fry foods. Cook foods using healthy methods such as baking, boiling, grilling, and broiling instead.  Cook with  heart-healthy oils, such as olive, canola, soybean, or sunflower oil. Meal planning  Eat a balanced diet that includes: ? 5 or more servings of fruits and vegetables each day. At each meal, try to fill half of your plate with fruits and vegetables. ? Up to 6-8 servings of whole grains each day. ? Less than 6 oz of lean meat, poultry, or fish each day. A 3-oz serving of meat is about the same size as a deck of cards. One egg equals 1 oz. ? 2 servings of low-fat dairy each day. ? A serving of nuts, seeds, or beans 5 times each week. ? Heart-healthy fats. Healthy fats called Omega-3 fatty acids are found in foods such as flaxseeds and coldwater fish, like sardines, salmon, and mackerel.  Limit how much you eat of the following: ? Canned or prepackaged foods. ? Food that is high in trans fat, such as fried foods. ? Food that is high in saturated fat, such as fatty meat. ? Sweets, desserts, sugary drinks, and other foods with added sugar. ? Full-fat dairy products.  Do not salt foods before eating.  Try to eat at least 2 vegetarian meals each week.  Eat more home-cooked food and less restaurant, buffet, and fast food.  When eating at a restaurant, ask that your food be prepared with less salt or no salt, if possible. What foods are recommended? The items listed may not be a complete list. Talk with your dietitian about   what dietary choices are best for you. Grains Whole-grain or whole-wheat bread. Whole-grain or whole-wheat pasta. Brown rice. Oatmeal. Quinoa. Bulgur. Whole-grain and low-sodium cereals. Pita bread. Low-fat, low-sodium crackers. Whole-wheat flour tortillas. Vegetables Fresh or frozen vegetables (raw, steamed, roasted, or grilled). Low-sodium or reduced-sodium tomato and vegetable juice. Low-sodium or reduced-sodium tomato sauce and tomato paste. Low-sodium or reduced-sodium canned vegetables. Fruits All fresh, dried, or frozen fruit. Canned fruit in natural juice (without  added sugar). Meat and other protein foods Skinless chicken or turkey. Ground chicken or turkey. Pork with fat trimmed off. Fish and seafood. Egg whites. Dried beans, peas, or lentils. Unsalted nuts, nut butters, and seeds. Unsalted canned beans. Lean cuts of beef with fat trimmed off. Low-sodium, lean deli meat. Dairy Low-fat (1%) or fat-free (skim) milk. Fat-free, low-fat, or reduced-fat cheeses. Nonfat, low-sodium ricotta or cottage cheese. Low-fat or nonfat yogurt. Low-fat, low-sodium cheese. Fats and oils Soft margarine without trans fats. Vegetable oil. Low-fat, reduced-fat, or light mayonnaise and salad dressings (reduced-sodium). Canola, safflower, olive, soybean, and sunflower oils. Avocado. Seasoning and other foods Herbs. Spices. Seasoning mixes without salt. Unsalted popcorn and pretzels. Fat-free sweets. What foods are not recommended? The items listed may not be a complete list. Talk with your dietitian about what dietary choices are best for you. Grains Baked goods made with fat, such as croissants, muffins, or some breads. Dry pasta or rice meal packs. Vegetables Creamed or fried vegetables. Vegetables in a cheese sauce. Regular canned vegetables (not low-sodium or reduced-sodium). Regular canned tomato sauce and paste (not low-sodium or reduced-sodium). Regular tomato and vegetable juice (not low-sodium or reduced-sodium). Pickles. Olives. Fruits Canned fruit in a light or heavy syrup. Fried fruit. Fruit in cream or butter sauce. Meat and other protein foods Fatty cuts of meat. Ribs. Fried meat. Bacon. Sausage. Bologna and other processed lunch meats. Salami. Fatback. Hotdogs. Bratwurst. Salted nuts and seeds. Canned beans with added salt. Canned or smoked fish. Whole eggs or egg yolks. Chicken or turkey with skin. Dairy Whole or 2% milk, cream, and half-and-half. Whole or full-fat cream cheese. Whole-fat or sweetened yogurt. Full-fat cheese. Nondairy creamers. Whipped toppings.  Processed cheese and cheese spreads. Fats and oils Butter. Stick margarine. Lard. Shortening. Ghee. Bacon fat. Tropical oils, such as coconut, palm kernel, or palm oil. Seasoning and other foods Salted popcorn and pretzels. Onion salt, garlic salt, seasoned salt, table salt, and sea salt. Worcestershire sauce. Tartar sauce. Barbecue sauce. Teriyaki sauce. Soy sauce, including reduced-sodium. Steak sauce. Canned and packaged gravies. Fish sauce. Oyster sauce. Cocktail sauce. Horseradish that you find on the shelf. Ketchup. Mustard. Meat flavorings and tenderizers. Bouillon cubes. Hot sauce and Tabasco sauce. Premade or packaged marinades. Premade or packaged taco seasonings. Relishes. Regular salad dressings. Where to find more information:  National Heart, Lung, and Blood Institute: www.nhlbi.nih.gov  American Heart Association: www.heart.org Summary  The DASH eating plan is a healthy eating plan that has been shown to reduce high blood pressure (hypertension). It may also reduce your risk for type 2 diabetes, heart disease, and stroke.  With the DASH eating plan, you should limit salt (sodium) intake to 2,300 mg a day. If you have hypertension, you may need to reduce your sodium intake to 1,500 mg a day.  When on the DASH eating plan, aim to eat more fresh fruits and vegetables, whole grains, lean proteins, low-fat dairy, and heart-healthy fats.  Work with your health care provider or diet and nutrition specialist (dietitian) to adjust your eating plan to your   individual calorie needs. This information is not intended to replace advice given to you by your health care provider. Make sure you discuss any questions you have with your health care provider. Document Revised: 08/01/2017 Document Reviewed: 08/12/2016 Elsevier Patient Education  2020 [employer] Inc. Carbohydrate Counting for Diabetes Mellitus, Adult  Carbohydrate counting is a method of keeping track of how many carbohydrates you  eat. Eating carbohydrates naturally increases the amount of sugar (glucose) in the blood. Counting how many carbohydrates you eat helps keep your blood glucose within normal limits, which helps you manage your diabetes (diabetes mellitus). It is important to know how many carbohydrates you can safely have in each meal. This is different for every person. A diet and nutrition specialist (registered dietitian) can help you make a meal plan and calculate how many carbohydrates you should have at each meal and snack. Carbohydrates are found in the following foods:  Grains, such as breads and cereals.  Dried beans and soy products.  Starchy vegetables, such as potatoes, peas, and corn.  Fruit and fruit juices.  Milk and yogurt.  Sweets and snack foods, such as cake, cookies, candy, chips, and soft drinks. How do I count carbohydrates? There are two ways to count carbohydrates in food. You can use either of the methods or a combination of both. Reading "Nutrition Facts" on packaged food The "Nutrition Facts" list is included on the labels of almost all packaged foods and beverages in the [place] It includes:  The serving size.  Information about nutrients in each serving, including the grams (g) of carbohydrate per serving. To use the "Nutrition Facts":  Decide how many servings you will have.  Multiply the number of servings by the number of carbohydrates per serving.  The resulting number is the total amount of carbohydrates that you will be having. Learning standard serving sizes of other foods When you eat carbohydrate foods that are not packaged or do not include "Nutrition Facts" on the label, you need to measure the servings in order to count the amount of carbohydrates:  Measure the foods that you will eat with a food scale or measuring cup, if needed.  Decide how many standard-size servings you will eat.  Multiply the number of servings by 15. Most carbohydrate-rich foods have  about 15 g of carbohydrates per serving. ? For example, if you eat 8 oz (170 g) of strawberries, you will have eaten 2 servings and 30 g of carbohydrates (2 servings x 15 g = 30 g).  For foods that have more than one food mixed, such as soups and casseroles, you must count the carbohydrates in each food that is included. The following list contains standard serving sizes of common carbohydrate-rich foods. Each of these servings has about 15 g of carbohydrates:   hamburger bun or  English muffin.   oz (15 mL) syrup.   oz (14 g) jelly.  1 slice of bread.  1 six-inch tortilla.  3 oz (85 g) cooked rice or pasta.  4 oz (113 g) cooked dried beans.  4 oz (113 g) starchy vegetable, such as peas, corn, or potatoes.  4 oz (113 g) hot cereal.  4 oz (113 g) mashed potatoes or  of a large baked potato.  4 oz (113 g) canned or frozen fruit.  4 oz (120 mL) fruit juice.  4-6 crackers.  6 chicken nuggets.  6 oz (170 g) unsweetened dry cereal.  6 oz (170 g) plain fat-free yogurt or yogurt sweetened with   artificial sweeteners.  8 oz (240 mL) milk.  8 oz (170 g) fresh fruit or one small piece of fruit.  24 oz (680 g) popped popcorn. Example of carbohydrate counting Sample meal  3 oz (85 g) chicken breast.  6 oz (170 g) brown rice.  4 oz (113 g) corn.  8 oz (240 mL) milk.  8 oz (170 g) strawberries with sugar-free whipped topping. Carbohydrate calculation 1. Identify the foods that contain carbohydrates: ? Rice. ? Corn. ? Milk. ? Strawberries. 2. Calculate how many servings you have of each food: ? 2 servings rice. ? 1 serving corn. ? 1 serving milk. ? 1 serving strawberries. 3. Multiply each number of servings by 15 g: ? 2 servings rice x 15 g = 30 g. ? 1 serving corn x 15 g = 15 g. ? 1 serving milk x 15 g = 15 g. ? 1 serving strawberries x 15 g = 15 g. 4. Add together all of the amounts to find the total grams of carbohydrates eaten: ? 30 g + 15 g + 15 g + 15  g = 75 g of carbohydrates total. Summary  Carbohydrate counting is a method of keeping track of how many carbohydrates you eat.  Eating carbohydrates naturally increases the amount of sugar (glucose) in the blood.  Counting how many carbohydrates you eat helps keep your blood glucose within normal limits, which helps you manage your diabetes.  A diet and nutrition specialist (registered dietitian) can help you make a meal plan and calculate how many carbohydrates you should have at each meal and snack. This information is not intended to replace advice given to you by your health care provider. Make sure you discuss any questions you have with your health care provider. Document Revised: 03/13/2017 Document Reviewed: 01/31/2016 Elsevier Patient Education  2020 Elsevier Inc.  

## 2020-01-03 ENCOUNTER — Ambulatory Visit: Payer: Self-pay | Admitting: Urology

## 2020-01-17 ENCOUNTER — Other Ambulatory Visit: Payer: Self-pay | Admitting: Gerontology

## 2020-01-17 DIAGNOSIS — N521 Erectile dysfunction due to diseases classified elsewhere: Secondary | ICD-10-CM

## 2020-01-17 DIAGNOSIS — E782 Mixed hyperlipidemia: Secondary | ICD-10-CM

## 2020-01-24 ENCOUNTER — Ambulatory Visit: Payer: Self-pay | Admitting: Urology

## 2020-01-26 ENCOUNTER — Ambulatory Visit: Payer: Self-pay | Admitting: Urology

## 2020-02-03 ENCOUNTER — Ambulatory Visit: Payer: Self-pay | Admitting: Gerontology

## 2020-02-10 ENCOUNTER — Encounter: Payer: Self-pay | Admitting: Urology

## 2020-02-10 ENCOUNTER — Ambulatory Visit: Payer: Self-pay | Admitting: Urology

## 2020-03-26 IMAGING — CR DG LUMBAR SPINE 2-3V
1 series · 3 of 3 positions shown · non-contrast
Comparison: None.

CLINICAL DATA: Left lower back pain radiating into the left hip.

EXAM:
LUMBAR SPINE - 2-3 VIEW

[Series 1: dg lumbar spine 2-3 views · 0.14mm/px · 3 of 3 slices shown]
[im 1/3]
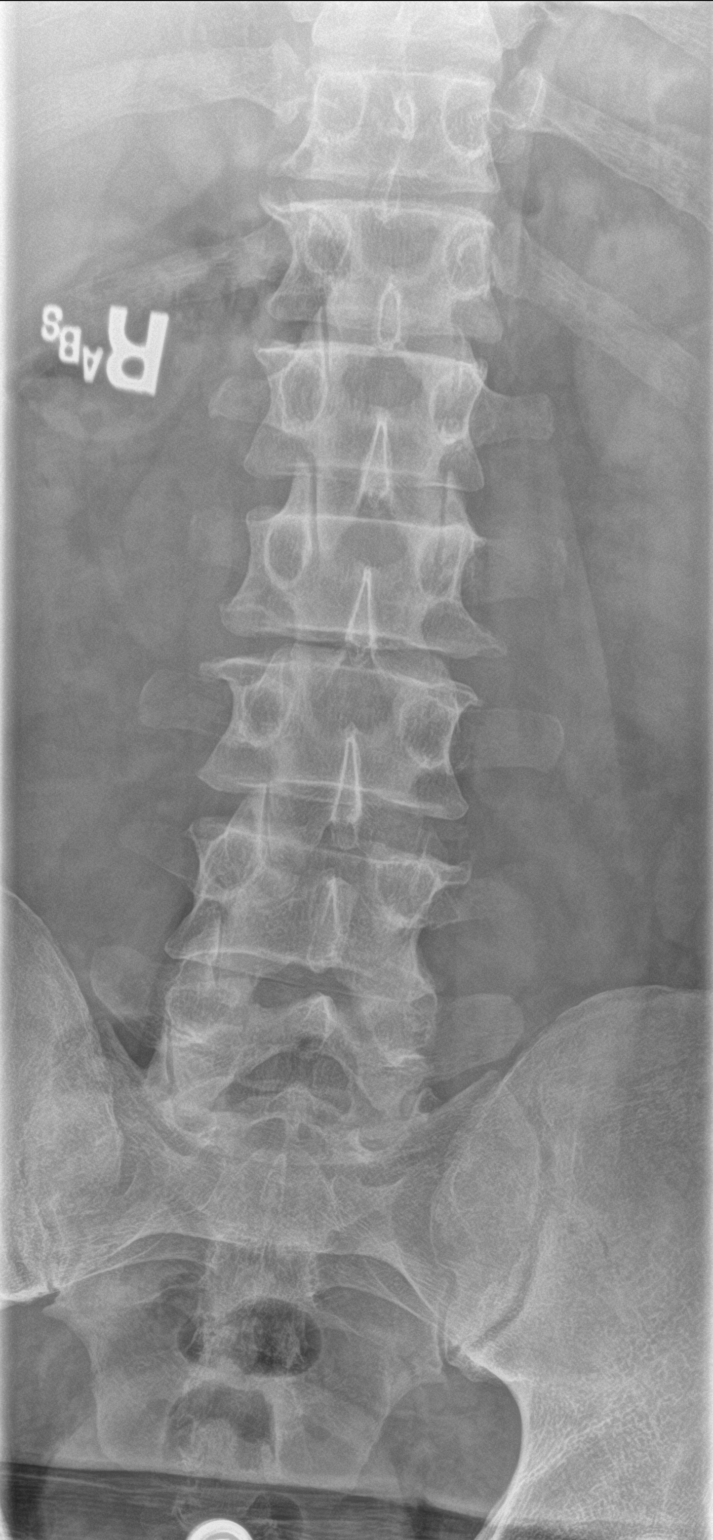
[im 2/3]
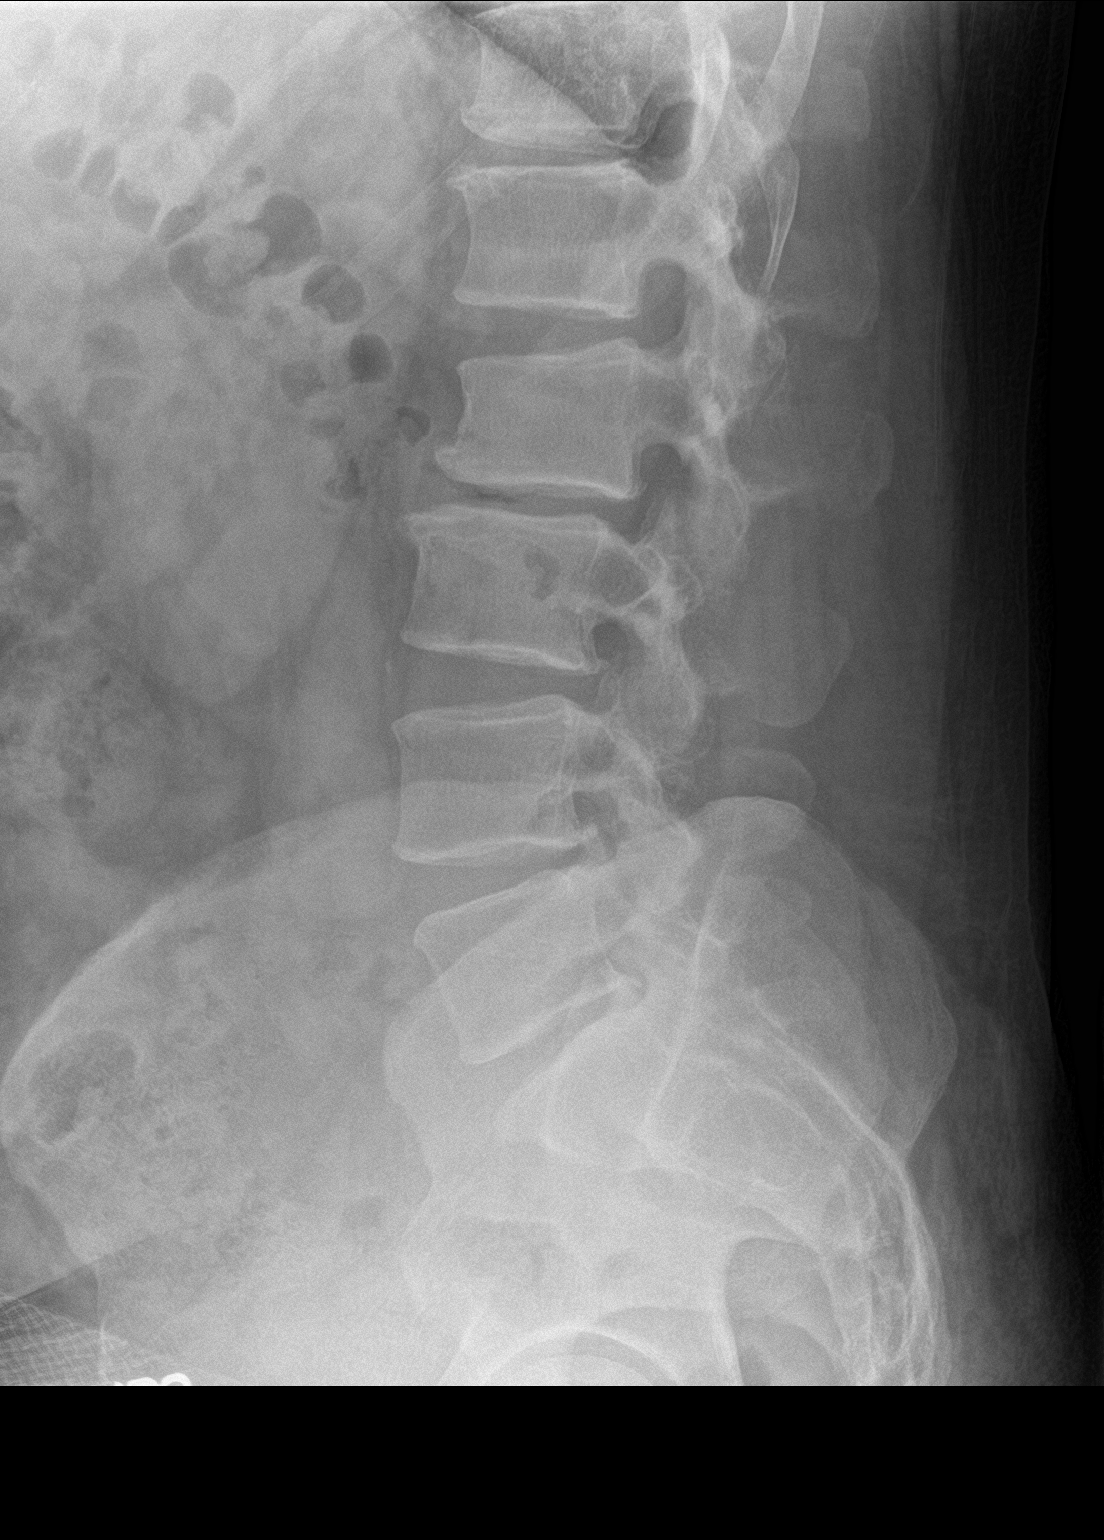
[im 3/3]
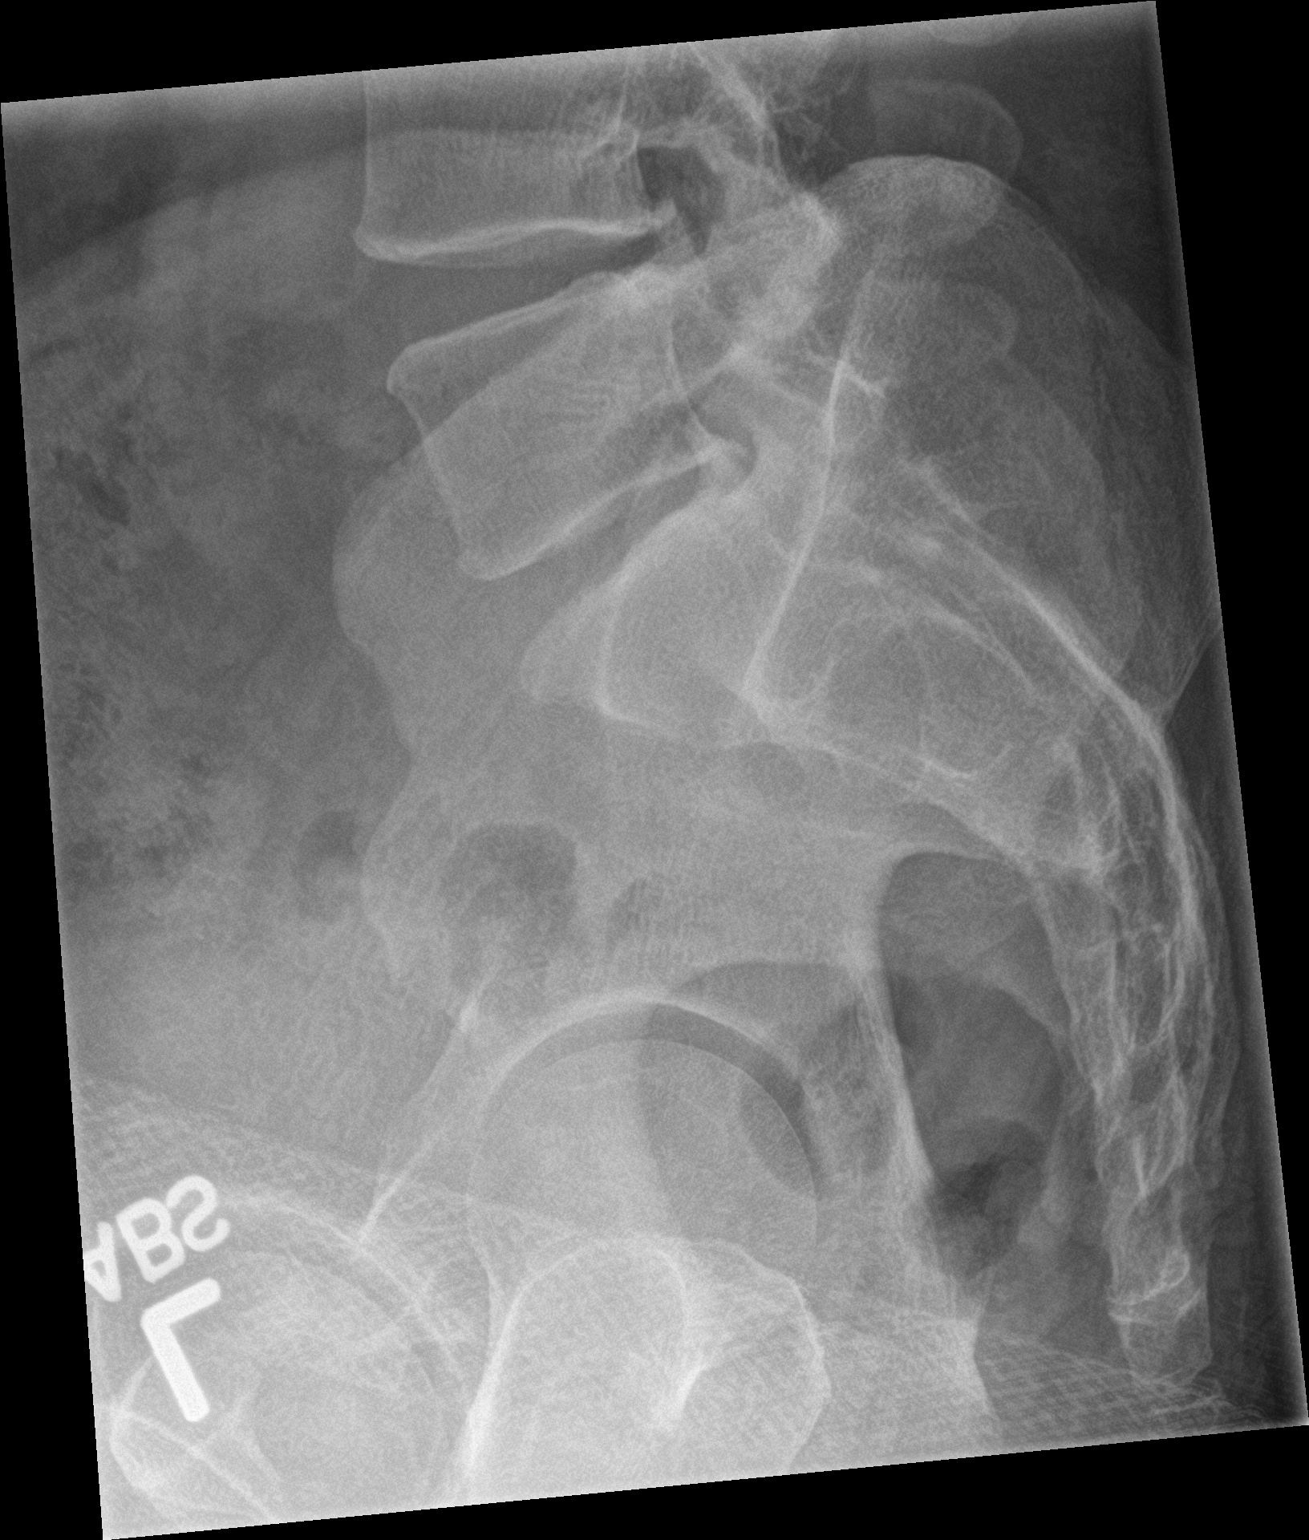

[3 of 3 positions shown; findings below may reference images not displayed]

FINDINGS: Five lumbar type vertebral bodies. No acute fracture or subluxation.
Vertebral body heights are preserved. 5 mm retrolisthesis at L2-L3.
Moderate disc height loss at L2-L3. Mild disc height loss at L4-L5
and L5-S1. The sacroiliac joints are unremarkable.
IMPRESSION: 1. Multilevel lumbar spondylosis as described above, moderate at
L2-L3.

## 2020-05-13 ENCOUNTER — Encounter: Payer: Self-pay | Admitting: Adult Health

## 2020-09-12 ENCOUNTER — Telehealth: Payer: Self-pay | Admitting: Pharmacy Technician

## 2020-09-12 NOTE — Telephone Encounter (Signed)
Patient failed to provide 2021 proof of income.  No additional medication assistance will be provided by [facility] without the required proof of income documentation.  Patient notified by letter.  Jailine Lieder J. Marijane Trower Care Manager [facility]   [address] [place], [place]  [address]     This is to inform you that you are no longer eligible to receive medication assistance at [facility].  The reason(s) are:    _____Your total gross monthly household income exceeds 250% of the Federal Poverty Level.   _____Tangible assets (savings, checking, stocks/bonds, pension, retirement, etc.) exceeds our limit  _____You are eligible to receive benefits from Medicaid, [facility] or HIV Medication              Assistance Program _____You are eligible to receive benefits from a Medicare Part "D" plan _____You have prescription insurance  _____You are not an [place] resident __X__Failure to provide all requested proof of income information for 2021.    Medication assistance will resume once all requested financial information has been returned to our clinic.  If you have questions, please contact our clinic at [phone].    Thank you,  [facility] 

## 2021-06-12 ENCOUNTER — Ambulatory Visit: Payer: 59 | Admitting: Podiatry

## 2021-06-12 ENCOUNTER — Other Ambulatory Visit: Payer: Self-pay

## 2021-06-12 DIAGNOSIS — Q666 Other congenital valgus deformities of feet: Secondary | ICD-10-CM

## 2021-06-14 ENCOUNTER — Telehealth: Payer: Self-pay | Admitting: *Deleted

## 2021-06-14 NOTE — Telephone Encounter (Signed)
"  I was there on Tuesday morning.  I talked to my insurance provider about getting orthotics done. They said I have a 20% con-insurance and that it needs authorization.  I'd like to set up for that.  Please give me a call."

## 2021-06-15 NOTE — Progress Notes (Signed)
Subjective:  Patient ID: Alexander [name], male    DOB: [birth year],  MRN: [id number]  Chief Complaint  Patient presents with   Toe Pain    Possible ingrown     50 y.o. male presents with the above complaint.  Patient presents with complaint of bilateral flatfoot.  Patient states that he is a diabetic and wanted to get it evaluated.  He states that it occasionally hurts him in the arch and the heel.  Hurts with ambulation.  He also had possible nail pain as well.  He wanted to get evaluated if there were ingrown.  He denies any treatment options he has not seen anyone else prior to seeing me.  He has not made any shoe gear modification.  He would like to discuss treatment options for this.  Review of Systems: Negative except as noted in the HPI. Denies N/V/F/Ch.  Past Medical History:  Diagnosis Date   Bipolar 1 disorder (HCC)    Herniated cervical disc    C6   Neck pain    Obesity    Pollen allergies    Type 2 diabetes mellitus (HCC)     Current Outpatient Medications:    atorvastatin (LIPITOR) 10 MG tablet, TAKE ONE TABLET BY MOUTH EVERY DAY, Disp: 30 tablet, Rfl: 0   cyclobenzaprine (FLEXERIL) 10 MG tablet, Take 1 tablet (10 mg total) by mouth 3 (three) times daily as needed. (Patient not taking: Reported on 12/23/2019), Disp: 15 tablet, Rfl: 0   glucose blood test strip, USE ONE STRIP tid to monitor blood glucose ACB, ACD and HS (Patient not taking: Reported on 12/23/2019), Disp: 100 each, Rfl: 12   Lancets MISC, 1 Stick by Does not apply route 3 (three) times daily. USE ONE LANCET tid to monitor blood glucose ACB, ACD and HS (Patient not taking: Reported on 12/23/2019), Disp: 90 each, Rfl: 3   metFORMIN (GLUCOPHAGE) 500 MG tablet, TAKE ONE TABLET BY Mouth once A DAY WITH MEALS, Disp: 180 tablet, Rfl: 0   tadalafil (CIALIS) 10 MG tablet, Take 1 tablet (10 mg total) by mouth daily as needed for erectile dysfunction., Disp: 10 tablet, Rfl: 0  Social History   Tobacco Use  Smoking  Status Every Day   Packs/day: 0.10   Years: 3.00   Pack years: 0.30   Types: Cigarettes   Last attempt to quit: 09/02/1996   Years since quitting: 24.8  Smokeless Tobacco Never  Tobacco Comments   only smoked socially through college.    No Known Allergies Objective:  There were no vitals filed for this visit. There is no height or weight on file to calculate BMI. Constitutional Well developed. Well nourished.  Vascular Dorsalis pedis pulses palpable bilaterally. Posterior tibial pulses palpable bilaterally. Capillary refill normal to all digits.  No cyanosis or clubbing noted. Pedal hair growth normal.  Neurologic Normal speech. Oriented to person, place, and time. Epicritic sensation to light touch grossly present bilaterally.  Dermatologic Nails well groomed and normal in appearance. No open wounds. No skin lesions.  Orthopedic: Gait examination shows pes planovalgus foot structure calcaneovalgus to many toe signs partially recruit the arch with flexion of the hallux.  Unable to perform single and double heel raise   Radiographs: None Assessment:   1. Pes planovalgus    Plan:  Patient was evaluated and treated and all questions answered.  Pes planovalgus semiflexible -I explained to patient the etiology of pes planovalgus and various treatment options were extensively discussed.  Given the amount of pain  that he is having I believe patient will benefit from custom-made orthotics to help control the hindfoot motion support the arch of the foot.  I discussed the benefit of over-the-counter versus custom orthotics.  For now patient would like to think about it and will call me back if he is interested in getting orthotics.  I told him he can schedule an appointment with [facility]/nursing schedule for orthotics.  No follow-ups on file.

## 2021-08-15 ENCOUNTER — Other Ambulatory Visit: Payer: 59

## 2021-08-15 ENCOUNTER — Other Ambulatory Visit: Payer: Self-pay

## 2021-08-15 DIAGNOSIS — Q666 Other congenital valgus deformities of feet: Secondary | ICD-10-CM | POA: Diagnosis not present

## 2021-10-12 ENCOUNTER — Other Ambulatory Visit: Payer: Self-pay

## 2021-10-12 ENCOUNTER — Ambulatory Visit (INDEPENDENT_AMBULATORY_CARE_PROVIDER_SITE_OTHER): Payer: Self-pay

## 2021-10-12 DIAGNOSIS — Q666 Other congenital valgus deformities of feet: Secondary | ICD-10-CM

## 2021-10-12 NOTE — Progress Notes (Signed)
SITUATION: Reason for Visit: Fitting and Delivery of Custom Fabricated Foot Orthoses Patient Report: Patient reports comfort and is satisfied with device.  OBJECTIVE DATA: Patient History / Diagnosis:     ICD-10-CM   1. Pes planovalgus  Q66.6       Provided Device:  Custom Functional Foot Orthotics  GOAL OF ORTHOSIS - Improve gait - Decrease energy expenditure - Improve Balance - Provide Triplanar stability of foot complex - Facilitate motion  ACTIONS PERFORMED Patient was fit with foot orthotics trimmed to shoe last. Patient tolerated fittign procedure.   Patient was provided with verbal and written instruction and demonstration regarding donning, doffing, wear, care, proper fit, function, purpose, cleaning, and use of the orthosis and in all related precautions and risks and benefits regarding the orthosis.  Patient was also provided with verbal instruction regarding how to report any failures or malfunctions of the orthosis and necessary follow up care. Patient was also instructed to contact our office regarding any change in status that may affect the function of the orthosis.  Patient demonstrated independence with proper donning, doffing, and fit and verbalized understanding of all instructions.  PLAN: Patient is to follow up in one week or as necessary (PRN). All questions were answered and concerns addressed. Plan of care was discussed with and agreed upon by the patient.

## 2021-12-24 DIAGNOSIS — Z1211 Encounter for screening for malignant neoplasm of colon: Secondary | ICD-10-CM | POA: Diagnosis not present

## 2021-12-24 DIAGNOSIS — Z01818 Encounter for other preprocedural examination: Secondary | ICD-10-CM | POA: Diagnosis not present

## 2022-04-15 ENCOUNTER — Encounter: Admission: RE | Payer: Self-pay | Source: Home / Self Care

## 2022-04-15 ENCOUNTER — Ambulatory Visit: Admission: RE | Admit: 2022-04-15 | Payer: Self-pay | Source: Home / Self Care

## 2022-04-15 SURGERY — COLONOSCOPY WITH PROPOFOL
Anesthesia: General
